# Patient Record
Sex: Male | Born: 1990 | Race: White | Hispanic: No | Marital: Single | Smoking: Former smoker
Health system: Southern US, Community
[De-identification: ages and names within clinical notes are randomized; demographics above are authoritative.]

## PROBLEM LIST (undated history)

## (undated) DIAGNOSIS — Z8701 Personal history of pneumonia (recurrent): Secondary | ICD-10-CM

## (undated) DIAGNOSIS — F419 Anxiety disorder, unspecified: Secondary | ICD-10-CM

## (undated) DIAGNOSIS — J45909 Unspecified asthma, uncomplicated: Secondary | ICD-10-CM

## (undated) HISTORY — PX: WISDOM TOOTH EXTRACTION: SHX21

## (undated) HISTORY — PX: CHOLECYSTECTOMY: SHX55

---

## 2013-11-19 ENCOUNTER — Telehealth: Payer: Self-pay | Admitting: Family Medicine

## 2013-11-19 NOTE — Telephone Encounter (Signed)
Nasal congestion, sore throat, and cough x 1 week.  Has not taken anything OTC. Has been exposed to strep throat.   [place] for abd pain 2 months ago. Diagnosed with gallstones. Has not followed up with anyone. Went back to ER 2 weeks ago and again told to see PCP for referral to surgeon.  Appt scheduled for tomorrow. Patient aware.

## 2013-11-20 ENCOUNTER — Ambulatory Visit (INDEPENDENT_AMBULATORY_CARE_PROVIDER_SITE_OTHER): Payer: Medicare Other | Admitting: Family Medicine

## 2013-11-20 ENCOUNTER — Encounter (INDEPENDENT_AMBULATORY_CARE_PROVIDER_SITE_OTHER): Payer: Self-pay

## 2013-11-20 ENCOUNTER — Encounter: Payer: Self-pay | Admitting: Family Medicine

## 2013-11-20 VITALS — BP 114/76 | HR 82 | Temp 98.4°F | Ht 74.0 in | Wt 282.4 lb

## 2013-11-20 DIAGNOSIS — J029 Acute pharyngitis, unspecified: Secondary | ICD-10-CM

## 2013-11-20 DIAGNOSIS — K8020 Calculus of gallbladder without cholecystitis without obstruction: Secondary | ICD-10-CM

## 2013-11-20 DIAGNOSIS — R21 Rash and other nonspecific skin eruption: Secondary | ICD-10-CM

## 2013-11-20 DIAGNOSIS — J069 Acute upper respiratory infection, unspecified: Secondary | ICD-10-CM

## 2013-11-20 LAB — POCT RAPID STREP A (OFFICE): Rapid Strep A Screen: NEGATIVE

## 2013-11-20 MED ORDER — AZITHROMYCIN 250 MG PO TABS: ORAL_TABLET | ORAL | Status: DC

## 2013-11-20 MED ORDER — METHYLPREDNISOLONE (PAK) 4 MG PO TABS: ORAL_TABLET | ORAL | Status: DC

## 2013-11-20 NOTE — Progress Notes (Signed)
   Subjective:    Patient ID: Alex Callahan, male    DOB: 10/16/1990, 23 y.o.   MRN: [id number]  HPI  This 23 y.o. male presents for evaluation of sore throat and uri sx's for a week. He has been having Some chronic abdominal pain and nausea and states he was told by ER physician to get his PCP to refer him to surgery.  He has had US which showed gallstones according to patient.  He is accompanied by his wife who states his tattoos are ugly and he has been having itching and Pruritis on the tattoos especially on the areas with red and he wants this checked out.  Review of Systems C/o pruritis, abdominal pain, sore throat, and uri sx's.   No chest pain, SOB, HA, dizziness, vision change, N/V, diarrhea, constipation, dysuria, urinary urgency or frequency, myalgias, arthralgias or rash.  Objective:   Physical Exam  Well developed well nourished male.  HEENT - Head atraumatic Normocephalic                Eyes - PERRLA, Conjuctiva - clear Sclera- Clear EOMI                Ears - EAC's Wnl TM's Wnl Gross Hearing WNL                Throat - oropharanx wnl Respiratory - Lungs CTA bilateral Cardiac - RRR S1 and S2 without murmur GI - Abdomen soft Nontender and bowel sounds active x 4 Extremities - No edema. Neuro - Grossly intact. Skin - Tattos on legs and shoulder with raised dry scaly rash     Assessment & Plan:  Sore throat - Plan: POCT rapid strep A, azithromycin (ZITHROMAX) 250 MG tablet, methylPREDNIsolone (MEDROL DOSPACK) 4 MG tablet, Ambulatory referral to General Surgery  Rash and nonspecific skin eruption - Plan: azithromycin (ZITHROMAX) 250 MG tablet, methylPREDNIsolone (MEDROL DOSPACK) 4 MG tablet, Ambulatory referral to General Surgery  URI (upper respiratory infection) - Plan: azithromycin (ZITHROMAX) 250 MG tablet, methylPREDNIsolone (MEDROL DOSPACK) 4 MG tablet, Ambulatory referral to General Surgery  Gallstones - Plan: azithromycin (ZITHROMAX) 250 MG tablet,  methylPREDNIsolone (MEDROL DOSPACK) 4 MG tablet, Ambulatory referral to General Surgery  Deatra CanterWilliam J Jeannene Tschetter FNP

## 2013-12-04 ENCOUNTER — Ambulatory Visit (INDEPENDENT_AMBULATORY_CARE_PROVIDER_SITE_OTHER): Payer: Medicaid Other | Admitting: Surgery

## 2016-12-18 ENCOUNTER — Encounter: Payer: Self-pay | Admitting: Family Medicine

## 2016-12-18 ENCOUNTER — Ambulatory Visit (INDEPENDENT_AMBULATORY_CARE_PROVIDER_SITE_OTHER): Payer: Medicaid Other | Admitting: Family Medicine

## 2016-12-18 DIAGNOSIS — K8020 Calculus of gallbladder without cholecystitis without obstruction: Secondary | ICD-10-CM

## 2016-12-18 NOTE — Patient Instructions (Signed)
Great to see you!  We are working on an ultrasound and a referral to General surgery in [place].   We will call with labs or send them on mychart within 1 week.    Cholelithiasis Cholelithiasis is also called "gallstones." It is a kind of gallbladder disease. The gallbladder is an organ that stores a liquid (bile) that helps you digest fat. Gallstones may not cause symptoms (may be silent gallstones) until they cause a blockage, and then they can cause pain (gallbladder attack). Follow these instructions at home:  Take over-the-counter and prescription medicines only as told by your doctor.  Stay at a healthy weight.  Eat healthy foods. This includes: ? Eating fewer fatty foods, like fried foods. ? Eating fewer refined carbs (refined carbohydrates). Refined carbs are breads and grains that are highly processed, like white bread and white rice. Instead, choose whole grains like whole-wheat bread and brown rice. ? Eating more fiber. Almonds, fresh fruit, and beans are healthy sources of fiber.  Keep all follow-up visits as told by your doctor. This is important. Contact a doctor if:  You have sudden pain in the upper right side of your belly (abdomen). Pain might spread to your right shoulder or your chest. This may be a sign of a gallbladder attack.  You feel sick to your stomach (are nauseous).  You throw up (vomit).  You have been diagnosed with gallstones that have no symptoms and you get: ? Belly pain. ? Discomfort, burning, or fullness in the upper part of your belly (indigestion). Get help right away if:  You have sudden pain in the upper right side of your belly, and it lasts for more than 2 hours.  You have belly pain that lasts for more than 5 hours.  You have a fever or chills.  You keep feeling sick to your stomach or you keep throwing up.  Your skin or the whites of your eyes turn yellow (jaundice).  You have dark-colored pee (urine).  You have  light-colored poop (stool). Summary  Cholelithiasis is also called "gallstones."  The gallbladder is an organ that stores a liquid (bile) that helps you digest fat.  Silent gallstones are gallstones that do not cause symptoms.  A gallbladder attack may cause sudden pain in the upper right side of your belly. Pain might spread to your right shoulder or your chest. If this happens, contact your doctor.  If you have sudden pain in the upper right side of your belly that lasts for more than 2 hours, get help right away. This information is not intended to replace advice given to you by your health care provider. Make sure you discuss any questions you have with your health care provider. Document Released: 12/12/2007 Document Revised: 03/11/2016 Document Reviewed: 03/11/2016 Elsevier Interactive Patient Education  2017 [employer]

## 2016-12-18 NOTE — Progress Notes (Signed)
   HPI  Patient presents today with right upper quadrant pain.  Patient has colicky-type quadrant pain with 2 episodes last week. He is concerned that it is due to his gallbladder.  Patient had ultrasound in 2014 that showed gallstones with distended gallbladder and a positive Murphy sign.  Patient describes it as episodic sharp persistent pain in the right upper quadrant. Caused by greasy foods, spicy foods,  PMH: Smoking status noted ROS: Per HPI  Objective: BP 109/72   Pulse 80   Temp 97.3 F (36.3 C) (Oral)   Ht '6\' 2"'$  (1.88 m)   Wt 276 lb 6.4 oz (125.4 kg)   BMI 35.49 kg/m  Gen: NAD, alert, cooperative with exam HEENT: NCAT CV: RRR, good S1/S2, no murmur Resp: CTABL, no wheezes, non-labored Abd: Soft, mild tenderness to palpation nonspecifically, no discrete tenderness to palpation right upper quadrant, no Murphy sign Ext: No edema, warm Neuro: Alert and oriented, No gross deficits  Assessment and plan:  # Cholelithiasis With characteristic right upper quadrant pain Labs Ultrasound, last ultrasound was 4 years ago Refer to general surgery  Orders Placed This Encounter  Procedures  . US Abdomen Complete    Standing Status:   Future    Standing Expiration Date:   02/17/2018    Order Specific Question:   Reason for Exam (SYMPTOM  OR DIAGNOSIS REQUIRED)    Answer:   cholelithiasis, US shown in 2014, now with pain.    Order Specific Question:   Preferred imaging location?    Answer:   [facility] Hepatic function panel  . CBC with Differential/Platelet  . Ambulatory referral to General Surgery    Referral Priority:   Routine    Referral Type:   Surgical    Referral Reason:   Specialty Services Required    Requested Specialty:   General Surgery    Number of Visits Requested:   [place], MD [facility] 12/18/2016, 4:03 PM

## 2016-12-19 LAB — CBC WITH DIFFERENTIAL/PLATELET
BASOS: 0 %
Basophils Absolute: 0 10*3/uL (ref 0.0–0.2)
EOS (ABSOLUTE): 0.5 10*3/uL — ABNORMAL HIGH (ref 0.0–0.4)
EOS: 5 %
HEMATOCRIT: 43.4 % (ref 37.5–51.0)
Hemoglobin: 14.8 g/dL (ref 13.0–17.7)
Immature Grans (Abs): 0 10*3/uL (ref 0.0–0.1)
Immature Granulocytes: 0 %
LYMPHS: 32 %
Lymphocytes Absolute: 3.4 10*3/uL — ABNORMAL HIGH (ref 0.7–3.1)
MCH: 29.4 pg (ref 26.6–33.0)
MCHC: 34.1 g/dL (ref 31.5–35.7)
MCV: 86 fL (ref 79–97)
Monocytes Absolute: 0.8 10*3/uL (ref 0.1–0.9)
Monocytes: 8 %
NEUTROS PCT: 55 %
Neutrophils Absolute: 5.9 10*3/uL (ref 1.4–7.0)
Platelets: 304 10*3/uL (ref 150–379)
RBC: 5.04 x10E6/uL (ref 4.14–5.80)
RDW: 14.1 % (ref 12.3–15.4)
WBC: 10.6 10*3/uL (ref 3.4–10.8)

## 2016-12-19 LAB — BMP8+EGFR
BUN/Creatinine Ratio: 13 (ref 9–20)
BUN: 14 mg/dL (ref 6–20)
CO2: 25 mmol/L (ref 20–29)
CREATININE: 1.08 mg/dL (ref 0.76–1.27)
Calcium: 9.5 mg/dL (ref 8.7–10.2)
Chloride: 100 mmol/L (ref 96–106)
GFR calc Af Amer: 110 mL/min/{1.73_m2} (ref >59)
GFR calc non Af Amer: 95 mL/min/{1.73_m2} (ref >59)
GLUCOSE: 90 mg/dL (ref 65–99)
Potassium: 4.1 mmol/L (ref 3.5–5.2)
SODIUM: 139 mmol/L (ref 134–144)

## 2016-12-19 LAB — HEPATIC FUNCTION PANEL
ALBUMIN: 4.6 g/dL (ref 3.5–5.5)
ALT: 19 IU/L (ref 0–44)
AST: 18 IU/L (ref 0–40)
Alkaline Phosphatase: 75 IU/L (ref 39–117)
Bilirubin Total: 0.7 mg/dL (ref 0.0–1.2)
Bilirubin, Direct: 0.18 mg/dL (ref 0.00–0.40)
TOTAL PROTEIN: 7 g/dL (ref 6.0–8.5)

## 2016-12-20 ENCOUNTER — Encounter: Payer: Self-pay | Admitting: Family Medicine

## 2017-01-03 ENCOUNTER — Other Ambulatory Visit: Payer: Self-pay

## 2017-01-29 ENCOUNTER — Ambulatory Visit: Payer: Self-pay | Admitting: General Surgery

## 2017-01-30 ENCOUNTER — Emergency Department (HOSPITAL_COMMUNITY): Payer: Medicaid Other

## 2017-01-30 ENCOUNTER — Emergency Department (HOSPITAL_COMMUNITY)
Admission: EM | Admit: 2017-01-30 | Discharge: 2017-01-30 | Disposition: A | Discharge status: Home / Self Care | Payer: Medicaid Other | Attending: Emergency Medicine | Admitting: Emergency Medicine

## 2017-01-30 ENCOUNTER — Encounter (HOSPITAL_COMMUNITY): Payer: Self-pay | Admitting: Emergency Medicine

## 2017-01-30 DIAGNOSIS — W292XXA Contact with other powered household machinery, initial encounter: Secondary | ICD-10-CM | POA: Diagnosis not present

## 2017-01-30 DIAGNOSIS — Y929 Unspecified place or not applicable: Secondary | ICD-10-CM | POA: Diagnosis not present

## 2017-01-30 DIAGNOSIS — F17210 Nicotine dependence, cigarettes, uncomplicated: Secondary | ICD-10-CM | POA: Diagnosis not present

## 2017-01-30 DIAGNOSIS — Y939 Activity, unspecified: Secondary | ICD-10-CM | POA: Diagnosis not present

## 2017-01-30 DIAGNOSIS — Y999 Unspecified external cause status: Secondary | ICD-10-CM | POA: Insufficient documentation

## 2017-01-30 DIAGNOSIS — S63642A Sprain of metacarpophalangeal joint of left thumb, initial encounter: Secondary | ICD-10-CM | POA: Insufficient documentation

## 2017-01-30 DIAGNOSIS — S60932A Unspecified superficial injury of left thumb, initial encounter: Secondary | ICD-10-CM | POA: Diagnosis present

## 2017-01-30 MED ORDER — TRAMADOL HCL 50 MG PO TABS: 50.0000 mg | ORAL_TABLET | Freq: Four times a day (QID) | ORAL | 0 refills | Status: DC | PRN

## 2017-01-30 MED ORDER — IBUPROFEN 600 MG PO TABS: 600.0000 mg | ORAL_TABLET | Freq: Four times a day (QID) | ORAL | 0 refills | Status: DC | PRN

## 2017-01-30 MED ORDER — TRAMADOL HCL 50 MG PO TABS
50.0000 mg | ORAL_TABLET | Freq: Once | ORAL | Status: AC
  Administered 2017-01-30: 50 mg via ORAL
  Filled 2017-01-30: qty 1

## 2017-01-30 MED ORDER — IBUPROFEN 800 MG PO TABS
800.0000 mg | ORAL_TABLET | Freq: Once | ORAL | Status: AC
  Administered 2017-01-30: 800 mg via ORAL
  Filled 2017-01-30: qty 1

## 2017-01-30 NOTE — Discharge Instructions (Addendum)
Wear the splint to protect your finger.  Use ice and elevation as much as possible for the next several days to help reduce the swelling.  Take the medications prescribed for pain and inflammation.  Call your doctor for a recheck of your injury in 1 week if not improving.  You may benefit from physical therapy of your finger if it is not getting better over the next week. You may take the tramadol prescribed for pain relief.  This will make you drowsy - do not drive within 4 hours of taking this medication.

## 2017-01-30 NOTE — ED Triage Notes (Signed)
Pt states he was moving an air compressor and it fell and when he went to catch it, it bent his left thumb backward.

## 2017-02-01 NOTE — ED Provider Notes (Signed)
[facility]Y DEPT Provider Note   CSN: [id number] Arrival date & time: 01/30/17  1727     History   Chief Complaint Chief Complaint  Patient presents with  . Finger Injury    HPI Alex Callahan is a 26 y.o.right handed male presenting for evaluation of a painful left thumb since he hyperextended the digit while attempting to lift a heavy air compressor earlier today. He reports it was dislocated, but he popped it back into place. Pain is constant, worse with movement and better at rest.  He denies numbness in the finger tip.  He has applied ice and has had no other treatments prior to arrival.  The history is provided by the patient, the spouse and a parent.    History reviewed. No pertinent past medical history.  Patient Active Problem List   Diagnosis Date Noted  . Cholelithiasis 12/18/2016    Past Surgical History:  Procedure Laterality Date  . WISDOM TOOTH EXTRACTION         Home Medications    Prior to Admission medications   Medication Sig Start Date End Date Taking? Authorizing Provider  ibuprofen (ADVIL,MOTRIN) 600 MG tablet Take 1 tablet (600 mg total) by mouth every 6 (six) hours as needed. 01/30/17   Burgess AmorIdol, Zackry Deines, PA-C  traMADol (ULTRAM) 50 MG tablet Take 1 tablet (50 mg total) by mouth every 6 (six) hours as needed. 01/30/17   Burgess AmorIdol, Edda Orea, PA-C    Family History History reviewed. No pertinent family history.  Social History Social History  Substance Use Topics  . Smoking status: Current Every Day Smoker    Packs/day: 1.00    Types: Cigarettes    Start date: 05/12/2001  . Smokeless tobacco: Not on file  . Alcohol use Yes     Comment: rare     Allergies   Patient has no known allergies.   Review of Systems Review of Systems  Constitutional: Negative for fever.  Musculoskeletal: Positive for arthralgias and joint swelling. Negative for myalgias.  Neurological: Negative for weakness and numbness.     Physical Exam Updated Vital Signs BP  107/73 (BP Location: Right Arm)   Pulse 92   Temp 98 F (36.7 C) (Oral)   Resp 16   Ht 6\' 3"  (1.905 m)   Wt 126.6 kg (279 lb)   SpO2 100%   BMI 34.87 kg/m   Physical Exam  Constitutional: He appears well-developed and well-nourished.  HENT:  Head: Atraumatic.  Neck: Normal range of motion.  Cardiovascular:  Pulses equal bilaterally  Musculoskeletal: He exhibits tenderness.       Left hand: He exhibits bony tenderness. He exhibits normal capillary refill and no deformity. Normal sensation noted. Decreased strength noted.  Decreased resistance of thumb secondary to pain.  He can flex/ext the digit with minimal pain.  Pain localized to cmc joint. No deformity. Distal sensation intact with less than 2 sec fingertip cap refill.  Neurological: He is alert. He has normal strength. He displays normal reflexes. No sensory deficit.  Skin: Skin is warm and dry.  Psychiatric: He has a normal mood and affect.     ED Treatments / Results  Labs (all labs ordered are listed, but only abnormal results are displayed) Labs Reviewed - No data to display  EKG  EKG Interpretation None       Radiology Dg Hand Complete Left  Result Date: 01/30/2017 CLINICAL DATA:  Air compressor fell on hand with thumb pain, initial encounter EXAM: LEFT HAND - COMPLETE 3+  VIEW COMPARISON:  None. FINDINGS: There is no evidence of fracture or dislocation. There is no evidence of arthropathy or other focal bone abnormality. Soft tissues are unremarkable. IMPRESSION: No acute abnormality noted. Electronically Signed   By: Alcide CleverMark  Lukens M.D.   On: 01/30/2017 18:27    Procedures Procedures (including critical care time)  Medications Ordered in ED Medications  ibuprofen (ADVIL,MOTRIN) tablet 800 mg (800 mg Oral Given 01/30/17 1837)  traMADol (ULTRAM) tablet 50 mg (50 mg Oral Given 01/30/17 2005)     Initial Impression / Assessment and Plan / ED Course  I have reviewed the triage vital signs and the nursing  notes.  Pertinent labs & imaging results that were available during my care of the patient were reviewed by me and considered in my medical decision making (see chart for details).     RICE, ibuprofen, tramadol. Thumb spica splint provided. Recheck by pcp if not improved x 1 week.  Final Clinical Impressions(s) / ED Diagnoses   Final diagnoses:  Sprain of metacarpophalangeal (MCP) joint of left thumb, initial encounter    New Prescriptions Discharge Medication List as of 01/30/2017  7:54 PM    START taking these medications   Details  ibuprofen (ADVIL,MOTRIN) 600 MG tablet Take 1 tablet (600 mg total) by mouth every 6 (six) hours as needed., Starting Wed 01/30/2017, Print    traMADol (ULTRAM) 50 MG tablet Take 1 tablet (50 mg total) by mouth every 6 (six) hours as needed., Starting Wed 01/30/2017, Print         Burgess Amordol, Arnez Stoneking, PA-C 02/01/17 1248    Rolland PorterJames, Mark, MD 02/11/17 [phone]

## 2017-12-21 IMAGING — CR DG HAND COMPLETE 3+V*L*
3 series · 3 of 3 positions shown · non-contrast
Comparison: None.

CLINICAL DATA: Air compressor fell on hand with thumb pain, initial
encounter

EXAM:
LEFT HAND - COMPLETE 3+ VIEW

[x hand pa left]
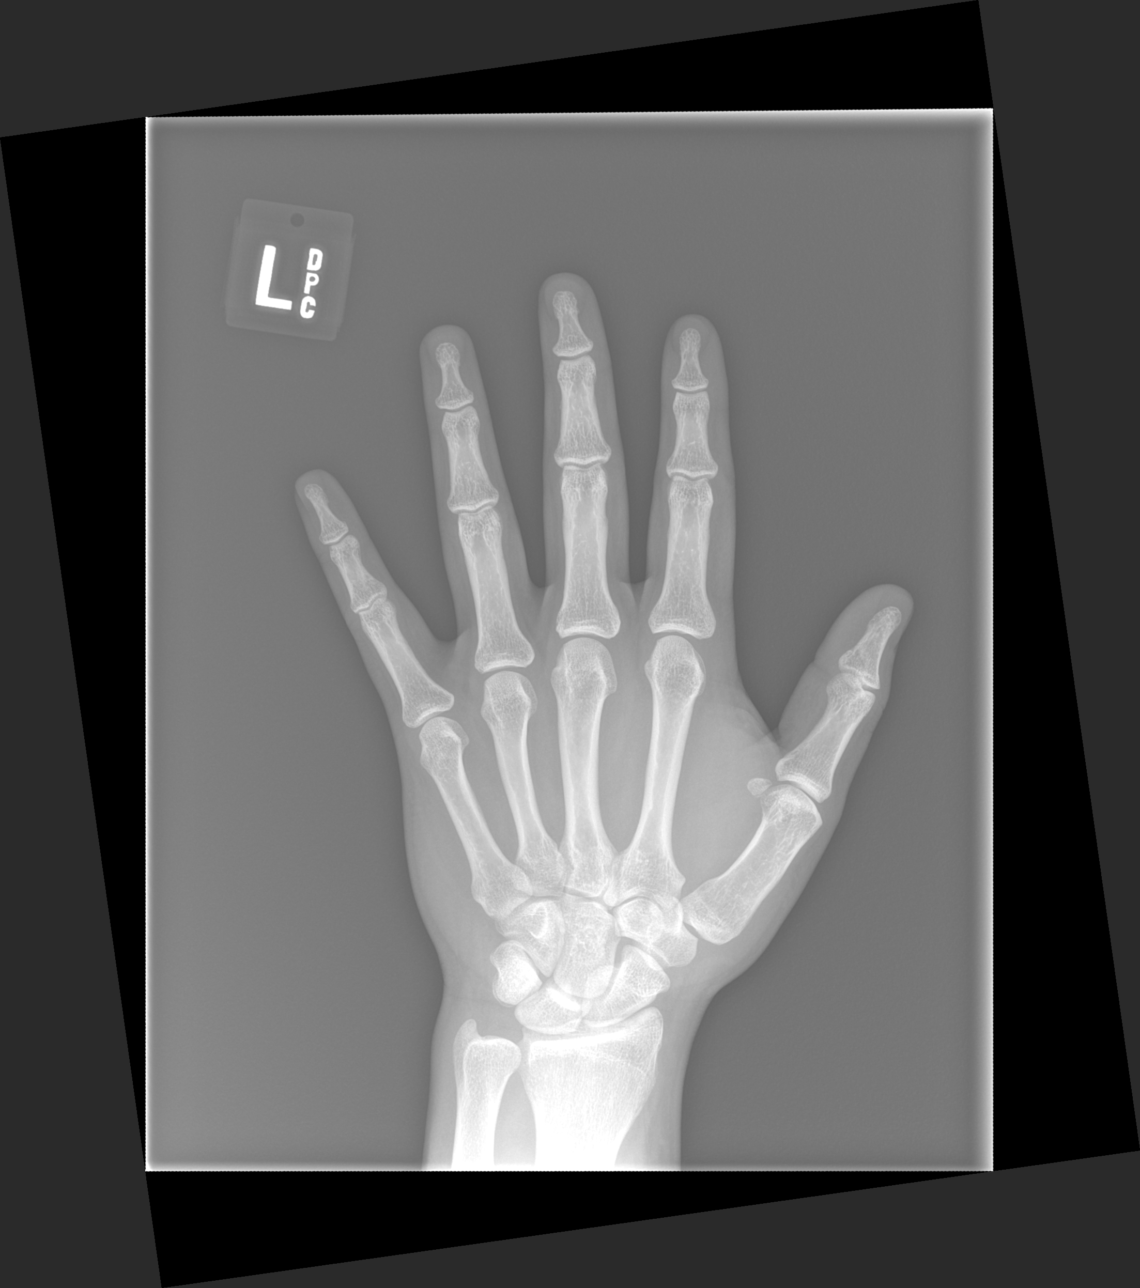

[x hand obl left]
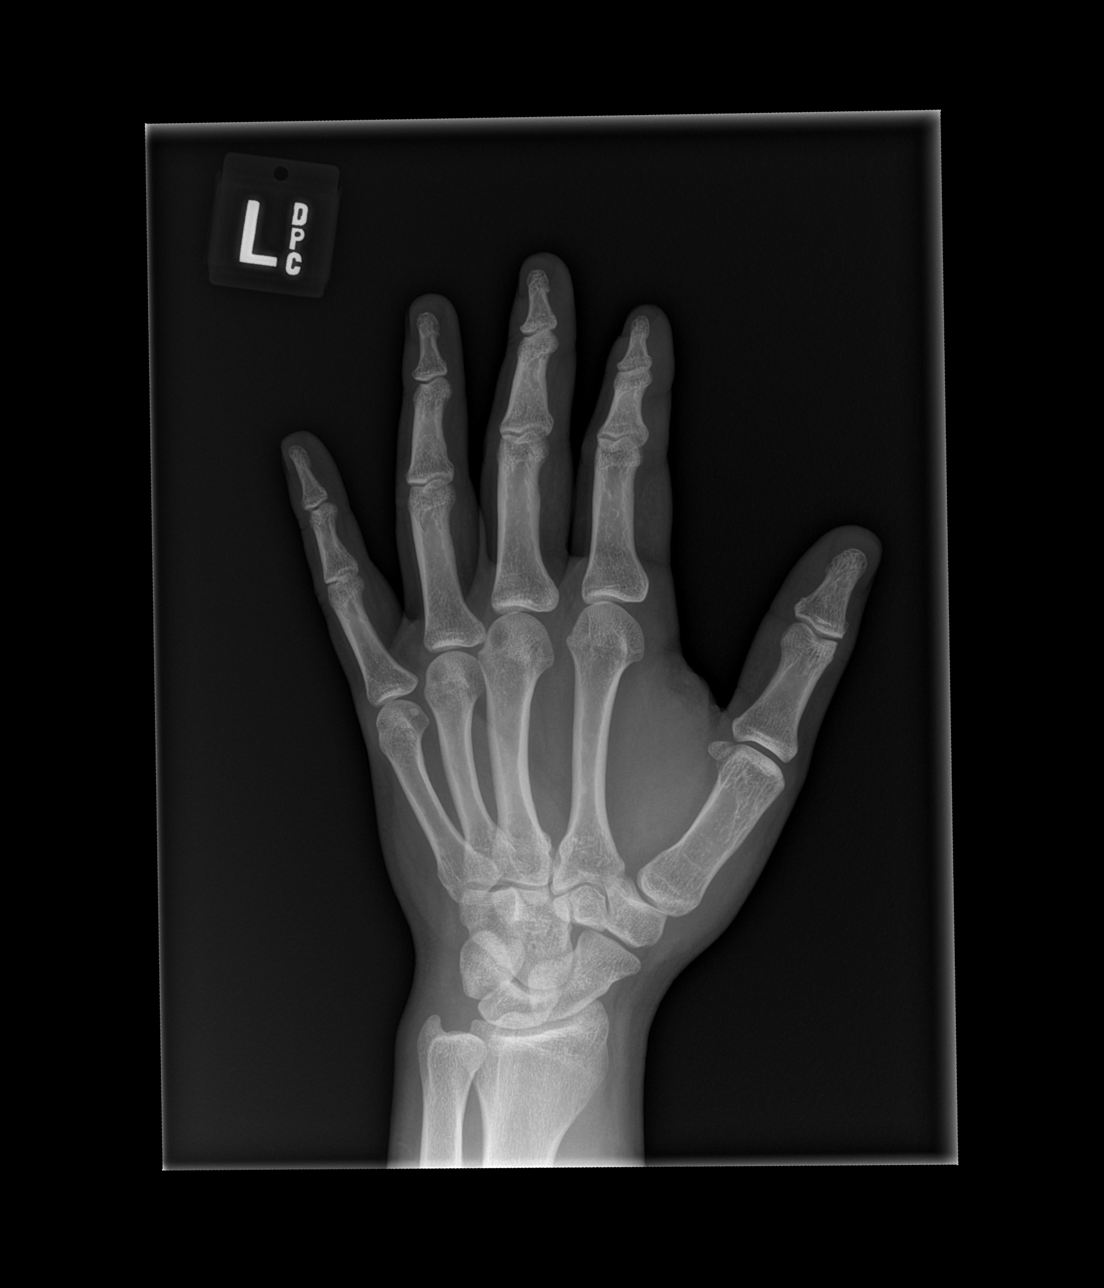

[x hand lat left]
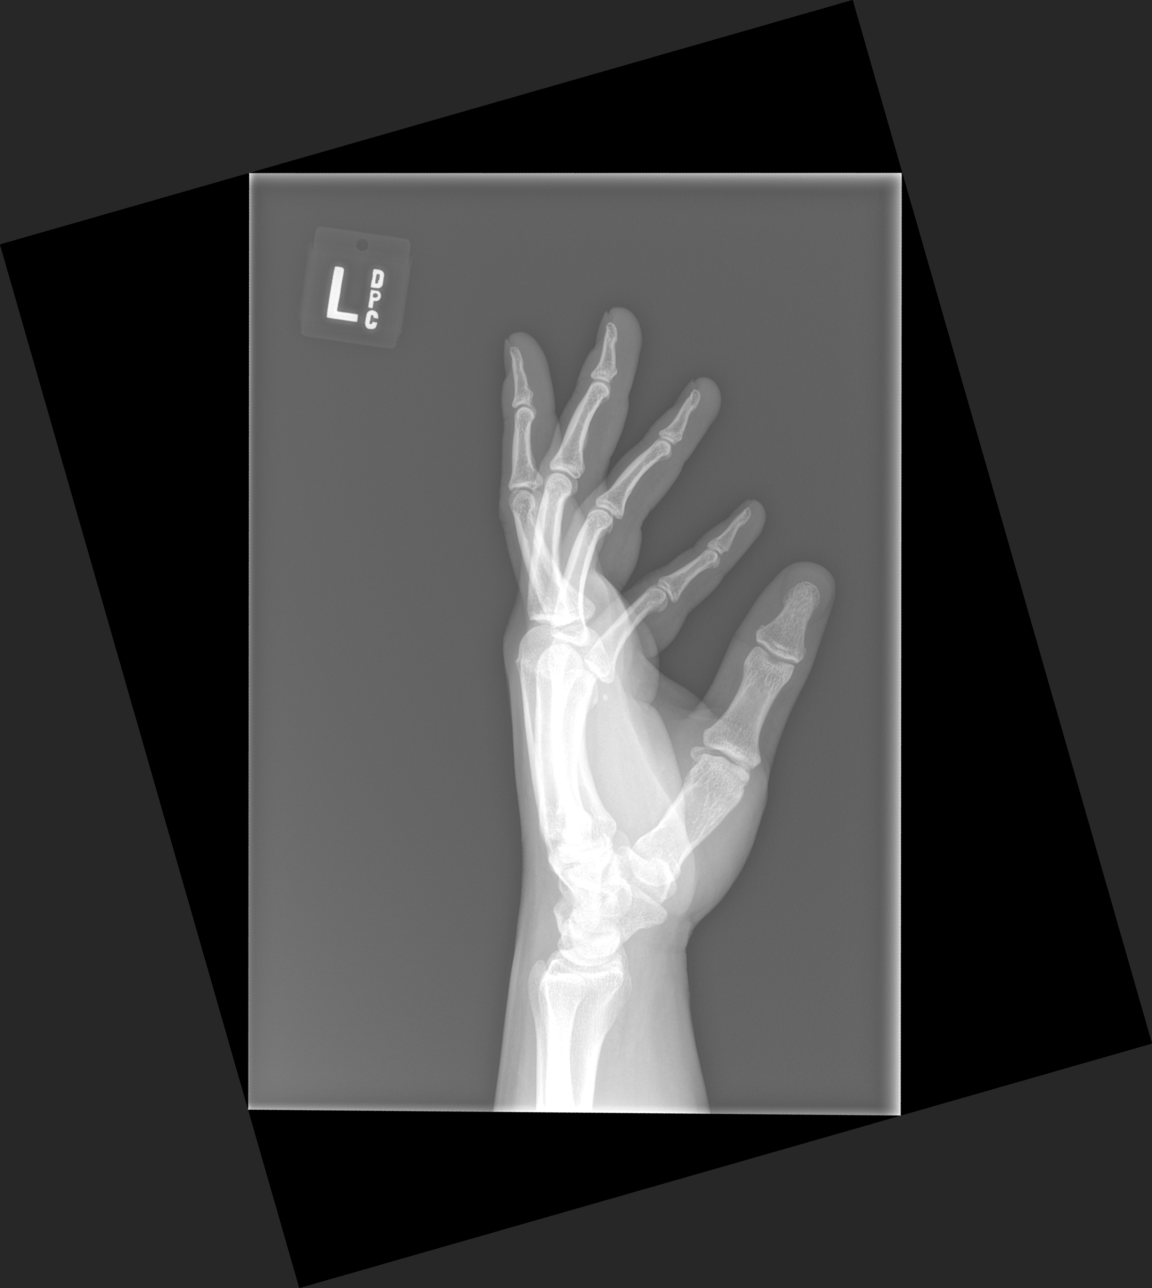

[3 of 3 positions shown; findings below may reference images not displayed]

FINDINGS: There is no evidence of fracture or dislocation. There is no
evidence of arthropathy or other focal bone abnormality. Soft
tissues are unremarkable.
IMPRESSION: No acute abnormality noted.

## 2018-05-13 ENCOUNTER — Encounter: Payer: Self-pay | Admitting: Family Medicine

## 2018-05-13 ENCOUNTER — Ambulatory Visit: Payer: Medicaid Other | Admitting: Family Medicine

## 2018-05-13 VITALS — BP 116/81 | HR 94 | Temp 98.9°F | Ht 75.0 in | Wt 295.0 lb

## 2018-05-13 DIAGNOSIS — F320 Major depressive disorder, single episode, mild: Secondary | ICD-10-CM | POA: Diagnosis not present

## 2018-05-13 DIAGNOSIS — R454 Irritability and anger: Secondary | ICD-10-CM

## 2018-05-13 DIAGNOSIS — H6502 Acute serous otitis media, left ear: Secondary | ICD-10-CM

## 2018-05-13 DIAGNOSIS — F411 Generalized anxiety disorder: Secondary | ICD-10-CM | POA: Diagnosis not present

## 2018-05-13 DIAGNOSIS — J069 Acute upper respiratory infection, unspecified: Secondary | ICD-10-CM

## 2018-05-13 MED ORDER — AMOXICILLIN 875 MG PO TABS
875.0000 mg | ORAL_TABLET | Freq: Two times a day (BID) | ORAL | 0 refills | Status: AC
Start: 2018-05-13 — End: 2018-05-23

## 2018-05-13 MED ORDER — BENZONATATE 100 MG PO CAPS: 100.0000 mg | ORAL_CAPSULE | Freq: Three times a day (TID) | ORAL | 0 refills | Status: AC | PRN

## 2018-05-13 MED ORDER — FLUOXETINE HCL 20 MG PO TABS
20.0000 mg | ORAL_TABLET | Freq: Every day | ORAL | 0 refills | Status: DC
Start: 2018-05-13 — End: 2018-05-16

## 2018-05-13 NOTE — Progress Notes (Signed)
Subjective:    Patient ID: Alex Callahan, male    DOB: October 13, 1990, 27 y.o.   MRN: [id number]  Chief Complaint:  Cough (x 3 weeks, productive, worse at night; taking Tylenol Cold and Cough and using cough drops) and Anger issues (ongoing for years but feels recently has worsened, gets angry over simple things)   HPI: Alex Callahan is a 27 y.o. male presenting on 05/13/2018 for Cough (x 3 weeks, productive, worse at night; taking Tylenol Cold and Cough and using cough drops) and Anger issues (ongoing for years but feels recently has worsened, gets angry over simple things)  Pt presents today for cough, congestion, and ear pressure. States this started 2 weeks ago and has become worse over the last 2 days. States he is now coughing up mucus. Denies fever, chills, headaches, or shortness of breath. States he has tried over the counter could and cough medications without relief.  Pt also reports increasing anxiety and anger outbursts. Pt states he has had issues with this for several years but has noticed an increase over the last 3 weeks. States he will yell at his family and kids for no reason. States this happens on a daily basis. States he has trouble sleeping at times and then wants to sleep all the time. States he worries on a regular basis and has a very difficult time enjoying things on a daily basis. He denies suicidal or homicidal ideations. States he has never been on medications for this and has not seen a Social worker. States he does not believe in counselors. He denies heat or cold intolerance, weight changes, hair or nail changes, or general weakness.    Office Visit from 05/13/2018 in [facility]  PHQ-9 Total Score  14     GAD 7 : Generalized Anxiety Score 05/13/2018  Nervous, Anxious, on Edge 3  Control/stop worrying 2  Worry too much - different things 2  Trouble relaxing 1  Restless 2  Easily annoyed or irritable 3  Afraid - awful might happen 0  Total  GAD 7 Score 13  Anxiety Difficulty Very difficult    Relevant past medical, surgical, family, and social history reviewed and updated as indicated.  Allergies and medications reviewed and updated.   History reviewed. No pertinent past medical history.  Past Surgical History:  Procedure Laterality Date  . WISDOM TOOTH EXTRACTION      Social History   Socioeconomic History  . Marital status: Single    Spouse name: Not on file  . Number of children: Not on file  . Years of education: Not on file  . Highest education level: Not on file  Occupational History  . Not on file  Social Needs  . Financial resource strain: Not on file  . Food insecurity:    Worry: Not on file    Inability: Not on file  . Transportation needs:    Medical: Not on file    Non-medical: Not on file  Tobacco Use  . Smoking status: Current Some Day Smoker    Packs/day: 0.25    Types: Cigarettes    Start date: 05/12/2001  . Smokeless tobacco: Former Systems developer    Types: Chew  Substance and Sexual Activity  . Alcohol use: Not Currently    Comment: rare  . Drug use: No  . Sexual activity: Not on file  Lifestyle  . Physical activity:    Days per week: Not on file    Minutes per session:  Not on file  . Stress: Not on file  Relationships  . Social connections:    Talks on phone: Not on file    Gets together: Not on file    Attends religious service: Not on file    Active member of club or organization: Not on file    Attends meetings of clubs or organizations: Not on file    Relationship status: Not on file  . Intimate partner violence:    Fear of current or ex partner: Not on file    Emotionally abused: Not on file    Physically abused: Not on file    Forced sexual activity: Not on file  Other Topics Concern  . Not on file  Social History Narrative  . Not on file    Outpatient Encounter Medications as of 05/13/2018  Medication Sig  . amoxicillin (AMOXIL) 875 MG tablet Take 1 tablet (875 mg  total) by mouth 2 (two) times daily for 10 days. 1 po BID  . benzonatate (TESSALON PERLES) 100 MG capsule Take 1 capsule (100 mg total) by mouth 3 (three) times daily as needed for up to 10 days for cough.  Marland Kitchen FLUoxetine (PROZAC) 20 MG tablet Take 1 tablet (20 mg total) by mouth daily.  . [DISCONTINUED] ibuprofen (ADVIL,MOTRIN) 600 MG tablet Take 1 tablet (600 mg total) by mouth every 6 (six) hours as needed.  . [DISCONTINUED] traMADol (ULTRAM) 50 MG tablet Take 1 tablet (50 mg total) by mouth every 6 (six) hours as needed.   No facility-administered encounter medications on file as of 05/13/2018.     No Known Allergies  Review of Systems  Constitutional: Positive for activity change. Negative for appetite change, chills, fatigue, fever and unexpected weight change.  HENT: Positive for congestion and ear pain. Negative for hearing loss, postnasal drip, rhinorrhea, sinus pressure, sinus pain and sore throat.   Respiratory: Positive for cough. Negative for chest tightness and shortness of breath.   Cardiovascular: Negative for chest pain, palpitations and leg swelling.  Gastrointestinal: Negative for abdominal pain, constipation, diarrhea, nausea and vomiting.  Endocrine: Negative for cold intolerance, heat intolerance, polydipsia, polyphagia and polyuria.  Musculoskeletal: Negative for myalgias.  Neurological: Negative for dizziness, weakness, light-headedness, numbness and headaches.  Psychiatric/Behavioral: Positive for agitation, behavioral problems, decreased concentration, dysphoric mood and sleep disturbance. Negative for confusion, hallucinations, self-injury and suicidal ideas. The patient is nervous/anxious. The patient is not hyperactive.   All other systems reviewed and are negative.       Objective:    BP 116/81   Pulse 94   Temp 98.9 F (37.2 C) (Oral)   Ht _0  (1.905 m)   Wt 295 lb (133.8 kg)   BMI 36.87 kg/m    Wt Readings from Last 3 Encounters:  05/13/18 295 lb  (133.8 kg)  01/30/17 279 lb (126.6 kg)  12/18/16 276 lb 6.4 oz (125.4 kg)    Physical Exam  Constitutional: He is oriented to person, place, and time. He appears well-developed and well-nourished. He is cooperative. No distress.  Poor hygiene   HENT:  Head: Normocephalic and atraumatic.  Right Ear: Hearing, tympanic membrane, external ear and ear canal normal.  Left Ear: Hearing, external ear and ear canal normal. Tympanic membrane is erythematous and bulging. Tympanic membrane is not perforated.  Nose: Mucosal edema present. No rhinorrhea. Right sinus exhibits no maxillary sinus tenderness and no frontal sinus tenderness. Left sinus exhibits no maxillary sinus tenderness and no frontal sinus tenderness.  Mouth/Throat: Uvula  is midline and mucous membranes are normal. Posterior oropharyngeal erythema present. No oropharyngeal exudate, posterior oropharyngeal edema or tonsillar abscesses. No tonsillar exudate.  Eyes: Pupils are equal, round, and reactive to light. Conjunctivae, EOM and lids are normal.  Neck: Trachea normal, full passive range of motion without pain and phonation normal. Neck supple. No thyroid mass and no thyromegaly present.  Cardiovascular: Normal rate, regular rhythm, S1 normal and S2 normal. Exam reveals no gallop and no friction rub.  No murmur heard. Pulmonary/Chest: Effort normal and breath sounds normal. No respiratory distress.  Lymphadenopathy:       Head (left side): Posterior auricular adenopathy present.  Neurological: He is alert and oriented to person, place, and time. He has normal strength and normal reflexes. No cranial nerve deficit or sensory deficit. He displays a negative Romberg sign.  Skin: Skin is warm, dry and intact. Capillary refill takes less than 2 seconds.  Psychiatric: His behavior is normal. Judgment and thought content normal. His mood appears anxious. He is not actively hallucinating. Cognition and memory are normal. He exhibits a depressed  mood. He expresses no homicidal and no suicidal ideation. He expresses no suicidal plans and no homicidal plans. He is attentive.  Nursing note and vitals reviewed.   Results for orders placed or performed in visit on 12/18/16  [facility]  Result Value Ref Range   Glucose 90 65 - 99 mg/dL   BUN 14 6 - 20 mg/dL   Creatinine, Ser 1.08 0.76 - 1.27 mg/dL   GFR calc non Af Amer 95 >59 mL/min/1.73   GFR calc Af Amer 110 >59 mL/min/1.73   BUN/Creatinine Ratio 13 9 - 20   Sodium 139 134 - 144 mmol/L   Potassium 4.1 3.5 - 5.2 mmol/L   Chloride 100 96 - 106 mmol/L   CO2 25 20 - 29 mmol/L   Calcium 9.5 8.7 - 10.2 mg/dL  Hepatic function panel  Result Value Ref Range   Total Protein 7.0 6.0 - 8.5 g/dL   Albumin 4.6 3.5 - 5.5 g/dL   Bilirubin Total 0.7 0.0 - 1.2 mg/dL   Bilirubin, Direct 0.18 0.00 - 0.40 mg/dL   Alkaline Phosphatase 75 39 - 117 IU/L   AST 18 0 - 40 IU/L   ALT 19 0 - 44 IU/L  CBC with Differential/Platelet  Result Value Ref Range   WBC 10.6 3.4 - 10.8 x10E3/uL   RBC 5.04 4.14 - 5.80 x10E6/uL   Hemoglobin 14.8 13.0 - 17.7 g/dL   Hematocrit 43.4 37.5 - 51.0 %   MCV 86 79 - 97 fL   MCH 29.4 26.6 - 33.0 pg   MCHC 34.1 31.5 - 35.7 g/dL   RDW 14.1 12.3 - 15.4 %   Platelets 304 150 - 379 x10E3/uL   Neutrophils 55 Not Estab. %   Lymphs 32 Not Estab. %   Monocytes 8 Not Estab. %   Eos 5 Not Estab. %   Basos 0 Not Estab. %   Neutrophils Absolute 5.9 1.4 - 7.0 x10E3/uL   Lymphocytes Absolute 3.4 (H) 0.7 - 3.1 x10E3/uL   Monocytes Absolute 0.8 0.1 - 0.9 x10E3/uL   EOS (ABSOLUTE) 0.5 (H) 0.0 - 0.4 x10E3/uL   Basophils Absolute 0.0 0.0 - 0.2 x10E3/uL   Immature Granulocytes 0 Not Estab. %   Immature Grans (Abs) 0.0 0.0 - 0.1 x10E3/uL       Pertinent labs & imaging results that were available during my care of the patient were reviewed by me and  considered in my medical decision making.  Assessment & Plan:  Derran was seen today for cough and anger issues.  Diagnoses and  all orders for this visit:  Difficulty controlling anger Referral to psychiatry for counseling. Intensity and frequency of anger outbursts have increased over the last few weeks. Stress management. Medications as prescribed.  -     FLUoxetine (PROZAC) 20 MG tablet; Take 1 tablet (20 mg total) by mouth daily. -     Ambulatory referral to Psychiatry  Current mild episode of major depressive disorder without prior episode [facility]) Referral to psychiatry for counseling. Stress management. Medications as prescribed.  -     TSH -     FLUoxetine (PROZAC) 20 MG tablet; Take 1 tablet (20 mg total) by mouth daily. -     Ambulatory referral to Psychiatry  Generalized anxiety disorder Referral to psychiatry for counseling. Stress management. Medications as prescribed.  -     TSH -     FLUoxetine (PROZAC) 20 MG tablet; Take 1 tablet (20 mg total) by mouth daily. -     Ambulatory referral to Psychiatry  Upper respiratory infection with cough and congestion Increase humidity in the air. Increase water intake. Mucinex as needed, can use regular or extra strength. Medications as prescribed for cough.  -     benzonatate (TESSALON PERLES) 100 MG capsule; Take 1 capsule (100 mg total) by mouth 3 (three) times daily as needed for up to 10 days for cough.  Non-recurrent acute serous otitis media of left ear Avoid cigarette smoke. Tylenol or motrin as needed for fever or pain control. Medications as prescribed.  -     amoxicillin (AMOXIL) 875 MG tablet; Take 1 tablet (875 mg total) by mouth 2 (two) times daily for 10 days. 1 po BID   Continue all other maintenance medications.  Follow up plan: Return in about 2 weeks (around 05/27/2018), or if symptoms worsen or fail to improve.  Educational handout given for otitis, URI, depression, anger, anxiety.   The above assessment and management plan was discussed with the patient. The patient verbalized understanding of and has agreed to the management plan.  Patient is aware to call the clinic if symptoms persist or worsen. Patient is aware when to return to the clinic for a follow-up visit. Patient educated on when it is appropriate to go to the emergency department.   Monia Pouch, FNP-C [facility] [phone]

## 2018-05-13 NOTE — Patient Instructions (Signed)
Upper Respiratory Infection, Adult Most upper respiratory infections (URIs) are caused by a virus. A URI affects the nose, throat, and upper air passages. The most common type of URI is often called "the common cold." Follow these instructions at home:  Take medicines only as told by your doctor.  Gargle warm saltwater or take cough drops to comfort your throat as told by your doctor.  Use a warm mist humidifier or inhale steam from a shower to increase air moisture. This may make it easier to breathe.  Drink enough fluid to keep your pee (urine) clear or pale yellow.  Eat soups and other clear broths.  Have a healthy diet.  Rest as needed.  Go back to work when your fever is gone or your doctor says it is okay. ? You may need to stay home longer to avoid giving your URI to others. ? You can also wear a face mask and wash your hands often to prevent spread of the virus.  Use your inhaler more if you have asthma.  Do not use any tobacco products, including cigarettes, chewing tobacco, or electronic cigarettes. If you need help quitting, ask your doctor. Contact a doctor if:  You are getting worse, not better.  Your symptoms are not helped by medicine.  You have chills.  You are getting more short of breath.  You have brown or red mucus.  You have yellow or brown discharge from your nose.  You have pain in your face, especially when you bend forward.  You have a fever.  You have puffy (swollen) neck glands.  You have pain while swallowing.  You have white areas in the back of your throat. Get help right away if:  You have very bad or constant: ? Headache. ? Ear pain. ? Pain in your forehead, behind your eyes, and over your cheekbones (sinus pain). ? Chest pain.  You have long-lasting (chronic) lung disease and any of the following: ? Wheezing. ? Long-lasting cough. ? Coughing up blood. ? A change in your usual mucus.  You have a stiff neck.  You have  changes in your: ? Vision. ? Hearing. ? Thinking. ? Mood. This information is not intended to replace advice given to you by your health care provider. Make sure you discuss any questions you have with your health care provider. Document Released: 12/12/2007 Document Revised: 02/26/2016 Document Reviewed: 09/30/2013 Elsevier Interactive Patient Education  2018 [employer]. Otitis Media, Adult Otitis media is redness, soreness, and puffiness (swelling) in the space just behind your eardrum (middle ear). It may be caused by allergies or infection. It often happens along with a cold. Follow these instructions at home:  Take your medicine as told. Finish it even if you start to feel better.  Only take over-the-counter or prescription medicines for pain, discomfort, or fever as told by your doctor.  Follow up with your doctor as told. Contact a doctor if:  You have otitis media only in one ear, or bleeding from your nose, or both.  You notice a lump on your neck.  You are not getting better in 3-5 days.  You feel worse instead of better. Get help right away if:  You have pain that is not helped with medicine.  You have puffiness, redness, or pain around your ear.  You get a stiff neck.  You cannot move part of your face (paralysis).  You notice that the bone behind your ear hurts when you touch it. This information is  not intended to replace advice given to you by your health care provider. Make sure you discuss any questions you have with your health care provider. Document Released: 12/12/2007 Document Revised: 12/01/2015 Document Reviewed: 01/20/2013 Elsevier Interactive Patient Education  2017 [employer]. Major Depressive Disorder, Adult Major depressive disorder (MDD) is a mental health condition. MDD often makes you feel sad, hopeless, or helpless. MDD can also cause symptoms in your body. MDD can affect your:  Work.  School.  Relationships.  Other normal  activities.  MDD can range from mild to very bad. It may occur once (single episode MDD). It can also occur many times (recurrent MDD). The main symptoms of MDD often include:  Feeling sad, depressed, or irritable most of the time.  Loss of interest.  MDD symptoms also include:  Sleeping too much or too little.  Eating too much or too little.  A change in your weight.  Feeling tired (fatigue) or having low energy.  Feeling worthless.  Feeling guilty.  Trouble making decisions.  Trouble thinking clearly.  Thoughts of suicide or harming others.  Feeling weak.  Feeling agitated.  Keeping yourself from being around other people (isolation).  Follow these instructions at home: Activity  Do these things as told by your doctor: ? Go back to your normal activities. ? Exercise regularly. ? Spend time outdoors. Alcohol  Talk with your doctor about how alcohol can affect your antidepressant medicines.  Do not drink alcohol. Or, limit how much alcohol you drink. ? This means no more than 1 drink a day for nonpregnant women and 2 drinks a day for men. One drink equals one of these:  12 oz of beer.  5 oz of wine.  1 oz of hard liquor. General instructions  Take over-the-counter and prescription medicines only as told by your doctor.  Eat a healthy diet.  Get plenty of sleep.  Find activities that you enjoy. Make time to do them.  Think about joining a support group. Your doctor may be able to suggest a group for you.  Keep all follow-up visits as told by your doctor. This is important. Where to find more information:  [employer] on Mental Illness: ? www.nami.org  [place] [employer] of Mental Health: ? http://www.maynard.net/  National Suicide Prevention Lifeline: ? 7152729501. This is free, 24-hour help. Contact a doctor if:  Your symptoms get worse.  You have new symptoms. Get help right away if:  You self-harm.  You see, hear, taste,  smell, or feel things that are not present (hallucinate). If you ever feel like you may hurt yourself or others, or have thoughts about taking your own life, get help right away. You can go to your nearest emergency department or call:  Your local emergency services (911 in the [place]  A suicide crisis helpline, such as the National Suicide Prevention Lifeline: ? 413-118-7081. This is open 24 hours a day.  This information is not intended to replace advice given to you by your health care provider. Make sure you discuss any questions you have with your health care provider. Document Released: 06/06/2015 Document Revised: 03/11/2016 Document Reviewed: 03/11/2016 Elsevier Interactive Patient Education  2017 [employer]. Tips for Managing Your Anger HOW CAN ANGER AFFECT MY LIFE? Everyone feels angry from time to time. It is okay and normal to feel angry. However, the way that you behave or react to anger can make it a problem. If you react too strongly to anger or you cannot control your  anger, that can cause relationships problems at home and work. Anger can also affect your health. Uncontrolled anger increases your risk of heart disease. When you are angry, your heart rate and blood pressure rise. Levels of certain energy hormones, such as adrenaline, also increase. When this happens, your heart has to work harder. In extreme cases, anger can cause the blood vessels to become narrow. This reduces the supply of blood and oxygen to the heart, and that can trigger chest pain (angina). Anger can also trigger stress-related problems, such as:  Headaches.  Poor digestion.  Trouble sleeping (insomnia).  WHAT ACTIONS CAN I TAKE TO HELP MANAGE MY ANGER? You can take actions to help you manage your anger. For example:  Express your anger. When you express your anger in a healthy way, it is a form of communication. The following strategies can help you to express your anger in a healthy way and  when you are ready to do so: ? Step away. When you are feeling reactive, it may take at least 20 minutes for your body to return to its normal blood pressure and heart rate. To help your body do this, take a walk, listen to music, stretch, take deep breaths, and avoid the situation or person who is making you angry. Try to only discuss your anger when you feel calm again. ? Try to consider how others feel before you react. Avoid swearing, sighing, raising your voice, or blaming. ? Choose a good time to work through problems. You may be more likely to lose your temper at the end of the day when you are tired. ? Keep an anger journal. Writing down the situations that make you angry can help you figure out what triggers your anger and why.  Consider changing your perception. Is there another way you can view the situation that will leave you with a different emotion? Sometimes, changing the way you think about a situation can make it seem less infuriating. Here are some ways to do that: ? Remind yourself that everyone is not out to get you. ? Remind yourself that a disappointing result is not the end of the world. ? Take steps to solve or prevent the situation that upsets you. ? Find the humor in an aggravating situation. ? Deal with the physical effects by taking deep breaths, exercising, or taking a walk. ? Slowly repeat the word "relax" or another calming phrase. ? Picture a relaxing image in your mind. Close your eyes and use that image to help you calm yourself.  WHEN SHOULD I SEEK ADDITIONAL HELP? Anger becomes a problem if it occurs frequently and lasts for long periods of time. You may also need help managing your anger if:  You use physical force or aggression when you are angry and others feel threatened and fearful.  You feel that your anger is out of control.  Anger is interfering with your job.  Anger is causing problems with your health.  Anger is causing problems with your  relationships.  Anger is affecting your ability to tolerate normal daily situations, such as sitting in a traffic jam or waiting in line.  You treat others disrespectfully.  You do not trust people around you.  It may help to ask someone you trust whether he or she thinks you show any of these signs. Sometimes, it can be hard to recognize the problem yourself. WHERE CAN I GET SUPPORT? A psychologist or another licensed mental health professional can help you learn  how to manage your anger. Ask your health care provider for a referral, or look online to find a psychologist who specializes in anger management. You can search the websites of many mental health organizations to find a mental health care provider. Local Domestic Abuse Projects are also available for help. Your local hospital or behavioral counselors in your area may also offer anger management programs or support groups that can help. WHERE CAN I FIND MORE INFORMATION?  The [place] Centers for Disease Control and Prevention: VisualTrips.hu  American Psychological Association: https://www.jennings-kim.com/  The Substance Abuse and Mental Health Services Administration: EpicRoom.pl  [employer] on Domestic Violence: ChurchReunion.co.uk  This information is not intended to replace advice given to you by your health care provider. Make sure you discuss any questions you have with your health care provider. Document Released: 04/22/2007 Document Revised: 02/26/2016 Document Reviewed: 04/29/2015 Elsevier Interactive Patient Education  2018 [employer]. Living With Anxiety After being diagnosed with an anxiety disorder, you may be relieved to know why you have felt or behaved a certain way. It is natural to also feel overwhelmed about the treatment ahead and what it will mean for your life. With care and support, you can manage  this condition and recover from it. How to cope with anxiety Dealing with stress Stress is your body's reaction to life changes and events, both good and bad. Stress can last just a few hours or it can be ongoing. Stress can play a major role in anxiety, so it is important to learn both how to cope with stress and how to think about it differently. Talk with your health care provider or a counselor to learn more about stress reduction. He or she may suggest some stress reduction techniques, such as:  Music therapy. This can include creating or listening to music that you enjoy and that inspires you.  Mindfulness-based meditation. This involves being aware of your normal breaths, rather than trying to control your breathing. It can be done while sitting or walking.  Centering prayer. This is a kind of meditation that involves focusing on a word, phrase, or sacred image that is meaningful to you and that brings you peace.  Deep breathing. To do this, expand your stomach and inhale slowly through your nose. Hold your breath for 3-5 seconds. Then exhale slowly, allowing your stomach muscles to relax.  Self-talk. This is a skill where you identify thought patterns that lead to anxiety reactions and correct those thoughts.  Muscle relaxation. This involves tensing muscles then relaxing them.  Choose a stress reduction technique that fits your lifestyle and personality. Stress reduction techniques take time and practice. Set aside 5-15 minutes a day to do them. Therapists can offer training in these techniques. The training may be covered by some insurance plans. Other things you can do to manage stress include:  Keeping a stress diary. This can help you learn what triggers your stress and ways to control your response.  Thinking about how you respond to certain situations. You may not be able to control everything, but you can control your reaction.  Making time for activities that help you relax,  and not feeling guilty about spending your time in this way.  Therapy combined with coping and stress-reduction skills provides the best chance for successful treatment. Medicines Medicines can help ease symptoms. Medicines for anxiety include:  Anti-anxiety drugs.  Antidepressants.  Beta-blockers.  Medicines may be used as the main treatment for anxiety disorder, along with therapy,  or if other treatments are not working. Medicines should be prescribed by a health care provider. Relationships Relationships can play a big part in helping you recover. Try to spend more time connecting with trusted friends and family members. Consider going to couples counseling, taking family education classes, or going to family therapy. Therapy can help you and others better understand the condition. How to recognize changes in your condition Everyone has a different response to treatment for anxiety. Recovery from anxiety happens when symptoms decrease and stop interfering with your daily activities at home or work. This may mean that you will start to:  Have better concentration and focus.  Sleep better.  Be less irritable.  Have more energy.  Have improved memory.  It is important to recognize when your condition is getting worse. Contact your health care provider if your symptoms interfere with home or work and you do not feel like your condition is improving. Where to find help and support: You can get help and support from these sources:  Self-help groups.  Online and [employer].  A trusted spiritual leader.  Couples counseling.  Family education classes.  Family therapy.  Follow these instructions at home:  Eat a healthy diet that includes plenty of vegetables, fruits, whole grains, low-fat dairy products, and lean protein. Do not eat a lot of foods that are high in solid fats, added sugars, or salt.  Exercise. Most adults should do the following: ? Exercise for  at least 150 minutes each week. The exercise should increase your heart rate and make you sweat (moderate-intensity exercise). ? Strengthening exercises at least twice a week.  Cut down on caffeine, tobacco, alcohol, and other potentially harmful substances.  Get the right amount and quality of sleep. Most adults need 7-9 hours of sleep each night.  Make choices that simplify your life.  Take over-the-counter and prescription medicines only as told by your health care provider.  Avoid caffeine, alcohol, and certain over-the-counter cold medicines. These may make you feel worse. Ask your pharmacist which medicines to avoid.  Keep all follow-up visits as told by your health care provider. This is important. Questions to ask your health care provider  Would I benefit from therapy?  How often should I follow up with a health care provider?  How long do I need to take medicine?  Are there any long-term side effects of my medicine?  Are there any alternatives to taking medicine? Contact a health care provider if:  You have a hard time staying focused or finishing daily tasks.  You spend many hours a day feeling worried about everyday life.  You become exhausted by worry.  You start to have headaches, feel tense, or have nausea.  You urinate more than normal.  You have diarrhea. Get help right away if:  You have a racing heart and shortness of breath.  You have thoughts of hurting yourself or others. If you ever feel like you may hurt yourself or others, or have thoughts about taking your own life, get help right away. You can go to your nearest emergency department or call:  Your local emergency services (911 in the [place]  A suicide crisis helpline, such as the National Suicide Prevention Lifeline at 540-597-7535. This is open 24-hours a day.  Summary  Taking steps to deal with stress can help calm you.  Medicines cannot cure anxiety disorders, but they can help ease  symptoms.  Family, friends, and partners can play a big part in  helping you recover from an anxiety disorder. This information is not intended to replace advice given to you by your health care provider. Make sure you discuss any questions you have with your health care provider. Document Released: 06/19/2016 Document Revised: 06/19/2016 Document Reviewed: 06/19/2016 Elsevier Interactive Patient Education  [employer].

## 2018-05-14 LAB — TSH: TSH: 0.834 u[IU]/mL (ref 0.450–4.500)

## 2018-05-16 ENCOUNTER — Telehealth: Payer: Self-pay

## 2018-05-16 DIAGNOSIS — F320 Major depressive disorder, single episode, mild: Secondary | ICD-10-CM

## 2018-05-16 DIAGNOSIS — R454 Irritability and anger: Secondary | ICD-10-CM

## 2018-05-16 DIAGNOSIS — F411 Generalized anxiety disorder: Secondary | ICD-10-CM

## 2018-05-16 MED ORDER — FLUOXETINE HCL 20 MG PO CAPS: 20.0000 mg | ORAL_CAPSULE | Freq: Every day | ORAL | 0 refills | Status: DC

## 2018-05-16 NOTE — Telephone Encounter (Signed)
Medicaid non preferred Fluoxetine HCL tablets   Fluoxetine capsules are preferred

## 2018-05-16 NOTE — Telephone Encounter (Signed)
I sent a new prescription of the one that should be covered, why do they not just substitute as permitted.

## 2018-05-27 ENCOUNTER — Ambulatory Visit: Payer: Medicaid Other | Admitting: Family Medicine

## 2018-05-28 ENCOUNTER — Encounter: Payer: Self-pay | Admitting: Family Medicine

## 2018-06-23 ENCOUNTER — Encounter: Payer: Self-pay | Admitting: Family Medicine

## 2018-06-23 ENCOUNTER — Ambulatory Visit: Payer: Medicaid Other | Admitting: Family Medicine

## 2018-06-23 VITALS — BP 122/82 | HR 77 | Temp 98.5°F | Ht 75.0 in | Wt 295.0 lb

## 2018-06-23 DIAGNOSIS — F411 Generalized anxiety disorder: Secondary | ICD-10-CM

## 2018-06-23 DIAGNOSIS — S60122A Contusion of left index finger with damage to nail, initial encounter: Secondary | ICD-10-CM | POA: Diagnosis not present

## 2018-06-23 DIAGNOSIS — S6010XA Contusion of unspecified finger with damage to nail, initial encounter: Secondary | ICD-10-CM

## 2018-06-23 MED ORDER — FLUOXETINE HCL 40 MG PO CAPS: 40.0000 mg | ORAL_CAPSULE | Freq: Every day | ORAL | 3 refills | Status: DC

## 2018-06-23 NOTE — Patient Instructions (Signed)

## 2018-06-23 NOTE — Progress Notes (Signed)
Subjective:    Patient ID: Alex Callahan, male    DOB: 1990/12/14, 27 y.o.   MRN: [id number]  Chief Complaint:  Followup anger (took Prozac,felt like it was not strong enough; out of med x 3 days) and Pain in left index finger (hit while working on car)   HPI: Alex Callahan is a 27 y.o. male presenting on 06/23/2018 for Followup anger (took Prozac,felt like it was not strong enough; out of med x 3 days) and Pain in left index finger (hit while working on car)  Pt presents today for anxiety follow up and left index finger injury.  Pt started Prozac on 05/13/18 for his anxiety and anger. States he has noticed a big improvement in symptoms but states he still has some anger outbursts and anxiety. He reports compliance with the medications and denies side effects. Pt states he feels a dose increase may be warranted.  GAD 7 : Generalized Anxiety Score 06/23/2018 05/13/2018  Nervous, Anxious, on Edge 2 3  Control/stop worrying 0 2  Worry too much - different things 1 2  Trouble relaxing 1 1  Restless 0 2  Easily annoyed or irritable 2 3  Afraid - awful might happen 0 0  Total GAD 7 Score 6 13  Anxiety Difficulty Somewhat difficult Very difficult     Office Visit from 06/23/2018 in [facility]  PHQ-9 Total Score  3     Pt also reports smashing his left index finger while working on a car. States this happened several days ago. States he has developed a bruise under his fingernail. States he tried to relieve this at home but was unable. States his finger is throbbing and has a lot of pressure under the nail. Denies loss of function or sensation.   Relevant past medical, surgical, family, and social history reviewed and updated as indicated.  Allergies and medications reviewed and updated.   History reviewed. No pertinent past medical history.  Past Surgical History:  Procedure Laterality Date  . WISDOM TOOTH EXTRACTION      Social History   Socioeconomic  History  . Marital status: Single    Spouse name: Not on file  . Number of children: Not on file  . Years of education: Not on file  . Highest education level: Not on file  Occupational History  . Not on file  Social Needs  . Financial resource strain: Not on file  . Food insecurity:    Worry: Not on file    Inability: Not on file  . Transportation needs:    Medical: Not on file    Non-medical: Not on file  Tobacco Use  . Smoking status: Current Some Day Smoker    Packs/day: 0.25    Types: Cigarettes    Start date: 05/12/2001  . Smokeless tobacco: Former Neurosurgeon    Types: Chew  Substance and Sexual Activity  . Alcohol use: Not Currently    Comment: rare  . Drug use: No  . Sexual activity: Not on file  Lifestyle  . Physical activity:    Days per week: Not on file    Minutes per session: Not on file  . Stress: Not on file  Relationships  . Social connections:    Talks on phone: Not on file    Gets together: Not on file    Attends religious service: Not on file    Active member of club or organization: Not on file    Attends  meetings of clubs or organizations: Not on file    Relationship status: Not on file  . Intimate partner violence:    Fear of current or ex partner: Not on file    Emotionally abused: Not on file    Physically abused: Not on file    Forced sexual activity: Not on file  Other Topics Concern  . Not on file  Social History Narrative  . Not on file    Outpatient Encounter Medications as of 06/23/2018  Medication Sig  . [DISCONTINUED] FLUoxetine (PROZAC) 20 MG capsule Take 1 capsule (20 mg total) by mouth daily.  Marland Kitchen FLUoxetine (PROZAC) 40 MG capsule Take 1 capsule (40 mg total) by mouth daily.   No facility-administered encounter medications on file as of 06/23/2018.     No Known Allergies  Review of Systems  Constitutional: Negative for activity change, chills, fatigue and fever.  Respiratory: Negative for shortness of breath.   Cardiovascular:  Negative for chest pain and palpitations.  Gastrointestinal: Negative for constipation, diarrhea, nausea and vomiting.  Genitourinary: Negative.   Skin: Positive for wound (hematoma to left index finger ).  Neurological: Negative for weakness and headaches.  Psychiatric/Behavioral: Positive for agitation and behavioral problems. Negative for confusion, decreased concentration, hallucinations, self-injury, sleep disturbance and suicidal ideas. The patient is nervous/anxious. The patient is not hyperactive.   All other systems reviewed and are negative.       Objective:    BP 122/82   Pulse 77   Temp 98.5 F (36.9 C) (Oral)   Ht 6\' 3"  (1.905 m)   Wt 295 lb (133.8 kg)   BMI 36.87 kg/m    Wt Readings from Last 3 Encounters:  06/23/18 295 lb (133.8 kg)  05/13/18 295 lb (133.8 kg)  01/30/17 279 lb (126.6 kg)    Physical Exam Vitals signs and nursing note reviewed.  Constitutional:      Appearance: He is not ill-appearing or toxic-appearing.     Comments: Unkept, not well groomed.  HENT:     Head: Normocephalic and atraumatic.     Mouth/Throat:     Lips: Pink.     Mouth: Mucous membranes are moist.     Pharynx: Oropharynx is clear.  Eyes:     General: Lids are normal.     Conjunctiva/sclera: Conjunctivae normal.  Cardiovascular:     Rate and Rhythm: Normal rate and regular rhythm.     Heart sounds: Normal heart sounds, S1 normal and S2 normal. No murmur. No friction rub. No gallop.   Pulmonary:     Effort: Pulmonary effort is normal.     Breath sounds: Normal breath sounds.  Musculoskeletal:     Left hand: He exhibits tenderness. He exhibits normal range of motion, no bony tenderness, normal two-point discrimination, normal capillary refill, no deformity, no laceration and no swelling. Normal sensation noted. Normal strength noted.       Hands:  Skin:    General: Skin is warm and dry.     Capillary Refill: Capillary refill takes less than 2 seconds.  Neurological:      Mental Status: He is alert and oriented to person, place, and time.     Sensory: Sensation is intact.     Motor: Motor function is intact.  Psychiatric:        Attention and Perception: Attention and perception normal.        Mood and Affect: Mood is anxious.        Speech: Speech normal.  Behavior: Behavior normal. Behavior is cooperative.        Thought Content: Thought content normal. Thought content does not include homicidal or suicidal ideation. Thought content does not include homicidal or suicidal plan.        Cognition and Memory: Cognition and memory normal.        Judgment: Judgment normal.     Results for orders placed or performed in visit on 05/13/18  TSH  Result Value Ref Range   TSH 0.834 0.450 - 4.500 uIU/mL       Pertinent labs & imaging results that were available during my care of the patient were reviewed by me and considered in my medical decision making.  Assessment & Plan:  Alex Callahan was seen today for followup anger and pain in left index finger.  Diagnoses and all orders for this visit:  Generalized anxiety disorder Stress management. Declined therapy. Medications as prescribed. Report any new or worsening symptoms.  -     FLUoxetine (PROZAC) 40 MG capsule; Take 1 capsule (40 mg total) by mouth daily.  Subungual hematoma of finger of left hand, initial encounter Nail trephination with electric cautery in office with immediate return of blood and instant relief of pressure and throbbing. Wound care discussed.     Continue all other maintenance medications.  Follow up plan: Return in about 4 weeks (around 07/21/2018), or if symptoms worsen or fail to improve.  Educational handout given for GAD  The above assessment and management plan was discussed with the patient. The patient verbalized understanding of and has agreed to the management plan. Patient is aware to call the clinic if symptoms persist or worsen. Patient is aware when to return to the  clinic for a follow-up visit. Patient educated on when it is appropriate to go to the emergency department.   Kari BaarsMichelle Hetvi Shawhan, FNP-C [facility] [phone]

## 2019-04-24 ENCOUNTER — Ambulatory Visit (INDEPENDENT_AMBULATORY_CARE_PROVIDER_SITE_OTHER): Payer: Medicaid Other | Admitting: Family Medicine

## 2019-04-24 ENCOUNTER — Emergency Department (HOSPITAL_COMMUNITY)
Admission: EM | Admit: 2019-04-24 | Discharge: 2019-04-24 | Disposition: A | Discharge status: Home / Self Care | Payer: Medicaid Other | Attending: Emergency Medicine | Admitting: Emergency Medicine

## 2019-04-24 ENCOUNTER — Encounter: Payer: Self-pay | Admitting: Family Medicine

## 2019-04-24 ENCOUNTER — Encounter (HOSPITAL_COMMUNITY): Payer: Self-pay | Admitting: Emergency Medicine

## 2019-04-24 ENCOUNTER — Telehealth: Payer: Self-pay | Admitting: Family Medicine

## 2019-04-24 DIAGNOSIS — F411 Generalized anxiety disorder: Secondary | ICD-10-CM | POA: Diagnosis not present

## 2019-04-24 DIAGNOSIS — IMO0002 Reserved for concepts with insufficient information to code with codable children: Secondary | ICD-10-CM | POA: Insufficient documentation

## 2019-04-24 DIAGNOSIS — Z915 Personal history of self-harm: Secondary | ICD-10-CM | POA: Diagnosis not present

## 2019-04-24 DIAGNOSIS — Z046 Encounter for general psychiatric examination, requested by authority: Secondary | ICD-10-CM | POA: Diagnosis not present

## 2019-04-24 DIAGNOSIS — F419 Anxiety disorder, unspecified: Secondary | ICD-10-CM | POA: Diagnosis not present

## 2019-04-24 DIAGNOSIS — R454 Irritability and anger: Secondary | ICD-10-CM | POA: Insufficient documentation

## 2019-04-24 DIAGNOSIS — F17210 Nicotine dependence, cigarettes, uncomplicated: Secondary | ICD-10-CM | POA: Insufficient documentation

## 2019-04-24 DIAGNOSIS — R440 Auditory hallucinations: Secondary | ICD-10-CM | POA: Insufficient documentation

## 2019-04-24 DIAGNOSIS — F329 Major depressive disorder, single episode, unspecified: Secondary | ICD-10-CM | POA: Insufficient documentation

## 2019-04-24 DIAGNOSIS — Z79899 Other long term (current) drug therapy: Secondary | ICD-10-CM | POA: Insufficient documentation

## 2019-04-24 HISTORY — DX: Anxiety disorder, unspecified: F41.9

## 2019-04-24 LAB — COMPREHENSIVE METABOLIC PANEL
ALT: 28 U/L (ref 0–44)
AST: 28 U/L (ref 15–41)
Albumin: 4.5 g/dL (ref 3.5–5.0)
Alkaline Phosphatase: 66 U/L (ref 38–126)
Anion gap: 11 (ref 5–15)
BUN: 10 mg/dL (ref 6–20)
CO2: 23 mmol/L (ref 22–32)
Calcium: 9.3 mg/dL (ref 8.9–10.3)
Chloride: 101 mmol/L (ref 98–111)
Creatinine, Ser: 0.98 mg/dL (ref 0.61–1.24)
GFR calc Af Amer: >60 mL/min (ref >60)
GFR calc non Af Amer: >60 mL/min (ref >60)
Glucose, Bld: 92 mg/dL (ref 70–99)
Potassium: 4 mmol/L (ref 3.5–5.1)
Sodium: 135 mmol/L (ref 135–145)
Total Bilirubin: 0.8 mg/dL (ref 0.3–1.2)
Total Protein: 7.5 g/dL (ref 6.5–8.1)

## 2019-04-24 LAB — RAPID URINE DRUG SCREEN, HOSP PERFORMED
Amphetamines: NOT DETECTED
Barbiturates: NOT DETECTED
Benzodiazepines: NOT DETECTED
Cocaine: NOT DETECTED
Opiates: NOT DETECTED
Tetrahydrocannabinol: NOT DETECTED

## 2019-04-24 LAB — CBC
HCT: 46.1 % (ref 39.0–52.0)
Hemoglobin: 15.5 g/dL (ref 13.0–17.0)
MCH: 29.5 pg (ref 26.0–34.0)
MCHC: 33.6 g/dL (ref 30.0–36.0)
MCV: 87.8 fL (ref 80.0–100.0)
Platelets: 337 10*3/uL (ref 150–400)
RBC: 5.25 MIL/uL (ref 4.22–5.81)
RDW: 13.1 % (ref 11.5–15.5)
WBC: 10.5 10*3/uL (ref 4.0–10.5)
nRBC: 0 % (ref 0.0–0.2)

## 2019-04-24 LAB — SALICYLATE LEVEL: Salicylate Lvl: <7 mg/dL (ref 2.8–30.0)

## 2019-04-24 LAB — ETHANOL: Alcohol, Ethyl (B): <10 mg/dL (ref <10)

## 2019-04-24 LAB — ACETAMINOPHEN LEVEL: Acetaminophen (Tylenol), Serum: <10 ug/mL — ABNORMAL LOW (ref 10–30)

## 2019-04-24 MED ORDER — HYDROXYZINE HCL 25 MG PO TABS: 25.0000 mg | ORAL_TABLET | Freq: Three times a day (TID) | ORAL | 0 refills | Status: DC | PRN

## 2019-04-24 NOTE — Patient Instructions (Signed)
If your symptoms worsen or you have thoughts of suicide/homicide, PLEASE SEEK IMMEDIATE MEDICAL ATTENTION.  You may always call the National Suicide Hotline.  This is available 24 hours a day, 7 days a week.  Their number is: 1-800-273-8255  Taking the medicine as directed and not missing any doses is one of the best things you can do to treat your depression.  Here are some things to keep in mind:  1) Side effects (stomach upset, some increased anxiety) may happen before you notice a benefit.  These side effects typically go away over time. 2) Changes to your dose of medicine or a change in medication all together is sometimes necessary 3) Most people need to be on medication at least 12 months 4) Many people will notice an improvement within two weeks but the full effect of the medication can take up to 4-6 weeks 5) Stopping the medication when you start feeling better often results in a return of symptoms 6) Never discontinue your medication without contacting a health care professional first.  Some medications require gradual discontinuation/ taper and can make you sick if you stop them abruptly.  If your symptoms worsen or you have thoughts of suicide/homicide, PLEASE SEEK IMMEDIATE MEDICAL ATTENTION.  You may always call:  National Suicide Hotline: 800-273-8255 [facility] Crisis Line: 336-832-9700 Crisis Recovery in [place]: 800-939-5911   These are available 24 hours a day, 7 days a week.   

## 2019-04-24 NOTE — BH Assessment (Signed)
Assessment Note  Alex Callahan is an 28 y.o. male that presents this date requesting OP referrals for anxiety and anger management issues. Patient was seen virtually by Rakes FNP at [facility] where he has been treated for anxiety. Per that note by Rakes FNP this date, "Pt presents today for follow up for anxiety and anger. Pt has not been seen in office since 05/13/2018. At this time his fluoxetine was increased to 40 mg daily for ongoing anxiety, anger, and slight depression. Pt states he has continued to experience explosive anger and took it upon himself to increase the fluoxetine to 80 mg daily. Pt states he did not see improvement at this dose so he stopped the medication all together about 2 weeks ago. States he is now very explosive at home and at work. States he feels his wife is going to leave with his children due to his anger and anxiety. Patient denied S/I or H/I." Patient was referred to [facility] to assist with referrals. Patient was requesting to see a counselor. This Probation officer met with patient and discussed services that patient was in need of. Patient denies any S/I, H/I or AVH. Patient voiced some AH on arrival stating he "was hearing someone call his name" although when this writer defined criteria for [facility] patient stated he felt it was just "self talk." Per notes this date patient has a history of general anxiety disorder, history of self-harm, and depression presenting for evaluation of anxiety. Patient states he has had trouble with anger issues throughout his life. His primary care doctor prescribed an antidepressant medication that he has been taking for the past several months but noticed no improvement of his symptoms. Reach out to his doctor for recommendation but was told to come to the ER to talk to a counselor. Patient states symptom has been an ongoing issue for many years. He denies any SI or HI. He does endorse some occasional auditory hallucination and admits to  talking to himself but denies any visual sedation. He denies alcohol abuse or polysubstance use. He does smoke cigarettes approximately a pack of cigarette every other day. He used to self cut but states that he was able to control and have not cut for "quite a while". He has not been evaluated by therapist yet. He has been taking his medication as prescribed and sometimes does double up the dose but noticed no improvement. Patient denies any history of S/I or H/I. Patient denies any previous attempts or gestures at self harm. Patient is requesting OP referrals this date to assist with counseling for developing coping mechanisms to better deal with anger management. Case was staffed with Reita Cliche DNP who recommended patient be provided with resources and discharged. Case was also staffed with provider Gertie Fey PA who agreed to discharge. Patient was provided with multiple OP resources in the area and discussed treatment interventions associated with different styles of counseling. Patient voiced understanding of information presented and contacted for safety. Patient states he will follow up with OP provider on Monday 04/27/19.       Diagnosis: GAD  Past Medical History:  Past Medical History:  Diagnosis Date  . Anxiety     Past Surgical History:  Procedure Laterality Date  . WISDOM TOOTH EXTRACTION      Family History:  Family History  Problem Relation Age of Onset  . Heart disease Mother   . Hypertension Mother   . Diabetes Mother   . Cancer Father  melanoma    Social History:  reports that he has been smoking cigarettes. He started smoking about 17 years ago. He has been smoking about 0.25 packs per day. He has quit using smokeless tobacco.  His smokeless tobacco use included chew. He reports previous alcohol use. He reports that he does not use drugs.  Additional Social History:  Alcohol / Drug Use Pain Medications: See MAR Prescriptions: See MAR Over the Counter: See MAR History of  alcohol / drug use?: No history of alcohol / drug abuse  CIWA: CIWA-Ar BP: 122/81 Pulse Rate: 90 COWS:    Allergies: No Known Allergies  Home Medications: (Not in a hospital admission)   OB/GYN Status:  No LMP for male patient.  General Assessment Data Location of Assessment: [facility] ED TTS Assessment: In system Is this a Tele or Face-to-Face Assessment?: Face-to-Face Is this an Initial Assessment or a Re-assessment for this encounter?: Initial Assessment Patient Accompanied by:: N/A Language Other than English: No Living Arrangements: Other (Comment) What gender do you identify as?: Male Marital status: Married Living Arrangements: Spouse/significant other Can pt return to current living arrangement?: Yes Admission Status: Voluntary Is patient capable of signing voluntary admission?: Yes Referral Source: Self/Family/Friend Insurance type: Medicaid  Medical Screening Exam (Delia) Medical Exam completed: Yes  Crisis Care Plan Living Arrangements: Spouse/significant other Legal Guardian: (NA) Name of Psychiatrist: None Name of Therapist: None  Education Status Is patient currently in school?: No Is the patient employed, unemployed or receiving disability?: Receiving disability income  Risk to self with the past 6 months Suicidal Ideation: No Has patient been a risk to self within the past 6 months prior to admission? : No Suicidal Intent: No Has patient had any suicidal intent within the past 6 months prior to admission? : No Is patient at risk for suicide?: No, but patient needs Medical Clearance Suicidal Plan?: No Has patient had any suicidal plan within the past 6 months prior to admission? : No Access to Means: No What has been your use of drugs/alcohol within the last 12 months?: Denies Previous Attempts/Gestures: No How many times?: 0 Other Self Harm Risks: (NA) Triggers for Past Attempts: (NA) Intentional Self Injurious Behavior: None Family  Suicide History: No Recent stressful life event(s): Other (Comment)(Excessive anxiety) Persecutory voices/beliefs?: No Depression: No Depression Symptoms: (Denies) Substance abuse history and/or treatment for substance abuse?: No Suicide prevention information given to non-admitted patients: Not applicable  Risk to Others within the past 6 months Homicidal Ideation: No Does patient have any lifetime risk of violence toward others beyond the six months prior to admission? : No Thoughts of Harm to Others: No Current Homicidal Intent: No Current Homicidal Plan: No Access to Homicidal Means: No Identified Victim: NA History of harm to others?: No Assessment of Violence: None Noted Violent Behavior Description: NA Does patient have access to weapons?: No Criminal Charges Pending?: No Does patient have a court date: No Is patient on probation?: No  Psychosis Hallucinations: None noted Delusions: None noted  Mental Status Report Appearance/Hygiene: Unremarkable Eye Contact: Good Motor Activity: Freedom of movement Speech: Logical/coherent Level of Consciousness: Alert Mood: Pleasant Affect: Appropriate to circumstance Anxiety Level: Minimal Thought Processes: Coherent, Relevant Judgement: Unimpaired Orientation: Person, Place, Time Obsessive Compulsive Thoughts/Behaviors: None  Cognitive Functioning Concentration: Normal Memory: Recent Intact, Remote Intact Is patient IDD: No Insight: Good Impulse Control: Good Appetite: Good Have you had any weight changes? : No Change Sleep: No Change Total Hours of Sleep: 7 Vegetative Symptoms: None  ADLScreening [facility] Assessment Services) Patient's cognitive ability adequate to safely complete daily activities?: Yes Patient able to express need for assistance with ADLs?: Yes Independently performs ADLs?: Yes (appropriate for developmental age)  Prior Inpatient Therapy Prior Inpatient Therapy: No  Prior Outpatient  Therapy Prior Outpatient Therapy: No Does patient have an ACCT team?: No Does patient have Intensive In-House Services?  : No Does patient have [facility] services? : No Does patient have P4CC services?: No  ADL Screening (condition at time of admission) Patient's cognitive ability adequate to safely complete daily activities?: Yes Is the patient deaf or have difficulty hearing?: No Does the patient have difficulty seeing, even when wearing glasses/contacts?: No Does the patient have difficulty concentrating, remembering, or making decisions?: No Patient able to express need for assistance with ADLs?: Yes Does the patient have difficulty dressing or bathing?: No Independently performs ADLs?: Yes (appropriate for developmental age) Does the patient have difficulty walking or climbing stairs?: No Weakness of Legs: None Weakness of Arms/Hands: None  Home Assistive Devices/Equipment Home Assistive Devices/Equipment: None  Therapy Consults (therapy consults require a physician order) PT Evaluation Needed: No OT Evalulation Needed: No SLP Evaluation Needed: No Abuse/Neglect Assessment (Assessment to be complete while patient is alone) Abuse/Neglect Assessment Can Be Completed: Yes Physical Abuse: Denies Verbal Abuse: Denies Sexual Abuse: Denies Exploitation of patient/patient's resources: Denies Self-Neglect: Denies Values / Beliefs Cultural Requests During Hospitalization: None Spiritual Requests During Hospitalization: None Consults Spiritual Care Consult Needed: No Social Work Consult Needed: No Regulatory affairs officer (For Healthcare) Does Patient Have a Medical Advance Directive?: No Would patient like information on creating a medical advance directive?: No - Patient declined          Disposition: Case was staffed with Reita Cliche DNP who recommended patient be provided with resources and discharged. Case was also staffed with provider Gertie Fey PA who agreed to discharge. Patient was  provided with multiple OP resources in the area and discussed treatment interventions associated with different styles of counseling. Patient voiced understanding of information presented and contacted for safety. Patient states he will follow up with OP provider on Monday 04/27/19.       Disposition Initial Assessment Completed for this Encounter: Yes Disposition of Patient: Discharge Patient refused recommended treatment: No Mode of transportation if patient is discharged/movement?: Car Patient referred to: Outpatient clinic referral  On Site Evaluation by:   Reviewed with Physician:    Mamie Nick 04/24/2019 5:12 PM

## 2019-04-24 NOTE — ED Notes (Signed)
Patient Alert and oriented to baseline. Stable and ambulatory to baseline. Patient verbalized understanding of the discharge instructions.  Patient belongings were taken by the patient.   

## 2019-04-24 NOTE — Progress Notes (Signed)
Virtual Visit via telephone Note Due to COVID-19 pandemic this visit was conducted virtually. This visit type was conducted due to national recommendations for restrictions regarding the COVID-19 Pandemic (e.g. social distancing, sheltering in place) in an effort to limit this patient's exposure and mitigate transmission in our community. All issues noted in this document were discussed and addressed.  A physical exam was not performed with this format.   I connected with [name] on 04/24/19 at 1050 by telephone and verified that I am speaking with the correct person using two identifiers. [name] is currently located at home and family is currently with them during visit. The provider, Monia Pouch, FNP is located in their office at time of visit.  I discussed the limitations, risks, security and privacy concerns of performing an evaluation and management service by telephone and the availability of in person appointments. I also discussed with the patient that there may be a patient responsible charge related to this service. The patient expressed understanding and agreed to proceed.  Subjective:  Patient ID: Alex Callahan, male    DOB: February 25, 1991, 28 y.o.   MRN: [id number]  Chief Complaint:  Anxiety and Anger   HPI: Alex Callahan is a 28 y.o. male presenting on 04/24/2019 for Anxiety and Anger   Pt presents today for follow up for anxiety and anger. Pt has not been seen in office since 05/13/2018. At this time his fluoxetine was increased to 40 mg daily for ongoing anxiety, anger, and slight depression. Pt states he has continued to experience explosive anger and took it upon himself to increase the fluoxetine to 80 mg daily. Pt states he did not see improvement at this dose so he stopped the medication all together about 2 weeks ago. States he is now very explosive at home and at work. States he feels his wife is going to leave with his children due to his anger and anxiety.     Pt reports he has issues with this several years ago and did not seek treatment because he felt it would go away. Pt states around the age of 56 he started hearing voices. States he is unsure of what the voices are saying but he knows he hears them. States he used to cut himself to relief his anger and anxiety. He has never felt suicidal or homicidal. States he would cut himself for a release of emotions.   He states he continues to hear the voices. States they will call his name. Denies any specific demands from the voices. At previous visits pt has declined referral to psychiatry. Pt is now willing to see psychiatry for management of his symptoms.   Pt denies SI or HI. No self mutilation in several years per pt report.   Anxiety Presents for follow-up visit. Symptoms include compulsions, decreased concentration, depressed mood, excessive worry, insomnia, irritability, muscle tension, nervous/anxious behavior, obsessions, palpitations, panic and restlessness. Patient reports no chest pain, confusion, dizziness, dry mouth, feeling of choking, hyperventilation, impotence, malaise, nausea, shortness of breath or suicidal ideas. Symptoms occur constantly. The severity of symptoms is severe, causing significant distress and interfering with daily activities. The quality of sleep is fair. Nighttime awakenings: several.   Compliance with medications is 0-25%.   Depression screen [facility] 2/9 04/24/2019 06/23/2018 05/13/2018 12/18/2016 11/20/2013  Decreased Interest 2 0 0 1 0  Down, Depressed, Hopeless 1 2 1 3  0  PHQ - 2 Score 3 2 1 4  0  Altered sleeping 1 0 0 0 -  Tired, decreased energy 1 1 3 3  -  Change in appetite 0 0 3 1 -  Feeling bad or failure about yourself  0 0 3 2 -  Trouble concentrating 2 0 2 3 -  Moving slowly or fidgety/restless 0 0 2 0 -  Suicidal thoughts 0 0 0 0 -  PHQ-9 Score 7 3 14 13  -   GAD 7 : Generalized Anxiety Score 04/24/2019 06/23/2018 05/13/2018  Nervous, Anxious, on Edge 3  2 3   Control/stop worrying 3 0 2  Worry too much - different things 3 1 2   Trouble relaxing 3 1 1   Restless 1 0 2  Easily annoyed or irritable 3 2 3   Afraid - awful might happen 3 0 0  Total GAD 7 Score 19 6 13   Anxiety Difficulty - Somewhat difficult Very difficult      Relevant past medical, surgical, family, and social history reviewed and updated as indicated.  Allergies and medications reviewed and updated.   History reviewed. No pertinent past medical history.  Past Surgical History:  Procedure Laterality Date   WISDOM TOOTH EXTRACTION      Social History   Socioeconomic History   Marital status: Single    Spouse name: Not on file   Number of children: Not on file   Years of education: Not on file   Highest education level: Not on file  Occupational History   Not on file  Social Needs   Financial resource strain: Not on file   Food insecurity    Worry: Not on file    Inability: Not on file   Transportation needs    Medical: Not on file    Non-medical: Not on file  Tobacco Use   Smoking status: Current Some Day Smoker    Packs/day: 0.25    Types: Cigarettes    Start date: 05/12/2001   Smokeless tobacco: Former NeurosurgeonUser    Types: Chew  Substance and Sexual Activity   Alcohol use: Not Currently    Comment: rare   Drug use: No   Sexual activity: Not on file  Lifestyle   Physical activity    Days per week: Not on file    Minutes per session: Not on file   Stress: Not on file  Relationships   Social connections    Talks on phone: Not on file    Gets together: Not on file    Attends religious service: Not on file    Active member of club or organization: Not on file    Attends meetings of clubs or organizations: Not on file    Relationship status: Not on file   Intimate partner violence    Fear of current or ex partner: Not on file    Emotionally abused: Not on file    Physically abused: Not on file    Forced sexual activity: Not on  file  Other Topics Concern   Not on file  Social History Narrative   Not on file    Outpatient Encounter Medications as of 04/24/2019  Medication Sig   FLUoxetine (PROZAC) 40 MG capsule Take 1 capsule (40 mg total) by mouth daily.   No facility-administered encounter medications on file as of 04/24/2019.     No Known Allergies  Review of Systems  Constitutional: Positive for activity change, fatigue and irritability. Negative for appetite change, chills, diaphoresis, fever and unexpected weight change.  HENT: Negative.   Eyes: Negative.  Negative for photophobia and visual disturbance.  Respiratory: Negative for cough, chest tightness and shortness of breath.   Cardiovascular: Positive for palpitations. Negative for chest pain and leg swelling.  Gastrointestinal: Negative for abdominal pain, blood in stool, constipation, diarrhea, nausea and vomiting.  Endocrine: Negative.   Genitourinary: Negative for decreased urine volume, difficulty urinating, dysuria, frequency, impotence and urgency.  Musculoskeletal: Negative for arthralgias and myalgias.  Skin: Negative.   Allergic/Immunologic: Negative.   Neurological: Negative for dizziness, tremors, seizures, syncope, facial asymmetry, speech difficulty, weakness, light-headedness, numbness and headaches.  Hematological: Negative.   Psychiatric/Behavioral: Positive for agitation, behavioral problems, decreased concentration, dysphoric mood, hallucinations, self-injury (in the past, not in several years) and sleep disturbance. Negative for confusion and suicidal ideas. The patient is nervous/anxious and has insomnia. The patient is not hyperactive.   All other systems reviewed and are negative.        Observations/Objective: No vital signs or physical exam, this was a telephone or virtual health encounter.  Pt alert and oriented, answers all questions appropriately, and able to speak in full sentences.    Assessment and  Plan: Menachem was seen today for anxiety and anger.  Diagnoses and all orders for this visit:  Difficulty controlling anger Generalized anxiety disorder Auditory hallucination History of self-harm Urgent referral to psychiatry. Psychiatry would like pt to go to ED at [facility] for evaluation and possible admission. Pt aware and states he will go. Pt continues to deny current self harm thoughts, SI, or HI. Pt agrees to go to ED for evaluation and treatment. States he has to do this so his family will not leave him.  -     Ambulatory referral to Psychiatry     Follow Up Instructions: Return in about 1 week (around 05/01/2019), or if symptoms worsen or fail to improve.    I discussed the assessment and treatment plan with the patient. The patient was provided an opportunity to ask questions and all were answered. The patient agreed with the plan and demonstrated an understanding of the instructions.   The patient was advised to call back or seek an in-person evaluation if the symptoms worsen or if the condition fails to improve as anticipated.  The above assessment and management plan was discussed with the patient. The patient verbalized understanding of and has agreed to the management plan. Patient is aware to call the clinic if they develop any new symptoms or if symptoms persist or worsen. Patient is aware when to return to the clinic for a follow-up visit. Patient educated on when it is appropriate to go to the emergency department.    I provided 25 minutes of non-face-to-face time during this encounter. The call started at 1050. The call ended at 1115. The other time was used for coordination of care.    Kari Baars, FNP-C [facility] [address] [place], [place] [address] [phone] 04/24/19

## 2019-04-24 NOTE — ED Provider Notes (Signed)
[facility] EMERGENCY DEPARTMENT Provider Note   CSN: [id number] Arrival date & time: 04/24/19  1341     History   Chief Complaint Chief Complaint  Patient presents with   Medical Clearance    HPI Alex Callahan is a 28 y.o. male.     The history is provided by the patient. No language interpreter was used.     28 year old male with history of general anxiety disorder, history of self-harm, and depression presenting for evaluation of anxiety.  Patient states he has had trouble with anger issues throughout his life.  His primary care doctor prescribed an antidepressant medication that he has been taking for the past several months but noticed no improvement of his symptoms.  Reach out to his doctor for recommendation but was told to come to the ER to talk to a counselor.  Patient states symptom has been an ongoing issue for many years.  He denies any SI or HI.  He does endorse some occasional auditory hallucination and admits to talking to himself but denies any visual sedation.  He denies alcohol abuse or polysubstance use.  He does smoke cigarettes approximately a pack of cigarette every other day.  He used to self cut but states that he was able to control and have a diet for "quite a while".  He has not been evaluated by therapist yet.  He has been taking his medication as prescribed and sometimes does double up the dose but noticed no improvement.  Past Medical History:  Diagnosis Date   Anxiety     Patient Active Problem List   Diagnosis Date Noted   Auditory hallucination 04/24/2019   History of self-harm 04/24/2019   Current mild episode of major depressive disorder without prior episode (HCC) 05/13/2018   Difficulty controlling anger 05/13/2018   Generalized anxiety disorder 05/13/2018   Cholelithiasis 12/18/2016    Past Surgical History:  Procedure Laterality Date   WISDOM TOOTH EXTRACTION          Home Medications    Prior to  Admission medications   Medication Sig Start Date End Date Taking? Authorizing Provider  FLUoxetine (PROZAC) 40 MG capsule Take 1 capsule (40 mg total) by mouth daily. 06/23/18   RakesDoralee Albino, FNP    Family History Family History  Problem Relation Age of Onset   Heart disease Mother    Hypertension Mother    Diabetes Mother    Cancer Father        melanoma    Social History Social History   Tobacco Use   Smoking status: Current Some Day Smoker    Packs/day: 0.25    Types: Cigarettes    Start date: 05/12/2001   Smokeless tobacco: Former Neurosurgeon    Types: Chew  Substance Use Topics   Alcohol use: Not Currently    Comment: rare   Drug use: No     Allergies   Patient has no known allergies.   Review of Systems Review of Systems  All other systems reviewed and are negative.    Physical Exam Updated Vital Signs BP 117/78 (BP Location: Right Arm)    Pulse 95    Temp 98.1 F (36.7 C) (Oral)    Resp 18    SpO2 97%   Physical Exam Vitals signs and nursing note reviewed.  Constitutional:      General: He is not in acute distress.    Appearance: He is well-developed.  HENT:     Head: Atraumatic.  Eyes:     Conjunctiva/sclera: Conjunctivae normal.  Neck:     Musculoskeletal: Neck supple.  Cardiovascular:     Rate and Rhythm: Normal rate and regular rhythm.     Pulses: Normal pulses.     Heart sounds: Normal heart sounds.  Pulmonary:     Effort: Pulmonary effort is normal.     Breath sounds: Normal breath sounds.  Abdominal:     Palpations: Abdomen is soft.     Tenderness: There is no abdominal tenderness.  Skin:    Findings: No rash.  Neurological:     Mental Status: He is alert and oriented to person, place, and time.     GCS: GCS eye subscore is 4. GCS verbal subscore is 5. GCS motor subscore is 6.  Psychiatric:        Attention and Perception: Attention normal.        Mood and Affect: Mood normal.        Speech: Speech normal.        Behavior:  Behavior is cooperative.        Thought Content: Thought content is not paranoid. Thought content does not include homicidal or suicidal ideation.      ED Treatments / Results  Labs (all labs ordered are listed, but only abnormal results are displayed) Labs Reviewed  ACETAMINOPHEN LEVEL - Abnormal; Notable for the following components:      Result Value   Acetaminophen (Tylenol), Serum <10 (*)    All other components within normal limits  COMPREHENSIVE METABOLIC PANEL  ETHANOL  SALICYLATE LEVEL  CBC  RAPID URINE DRUG SCREEN, HOSP PERFORMED    EKG None  Radiology No results found.  Procedures Procedures (including critical care time)  Medications Ordered in ED Medications - No data to display   Initial Impression / Assessment and Plan / ED Course  I have reviewed the triage vital signs and the nursing notes.  Pertinent labs & imaging results that were available during my care of the patient were reviewed by me and considered in my medical decision making (see chart for details).        BP 117/78 (BP Location: Right Arm)    Pulse 95    Temp 98.1 F (36.7 C) (Oral)    Resp 18    SpO2 97%    Final Clinical Impressions(s) / ED Diagnoses   Final diagnoses:  Excessive anger    ED Discharge Orders         Ordered    hydrOXYzine (ATARAX/VISTARIL) 25 MG tablet  Every 8 hours PRN     04/24/19 1646         3:26 PM Patient here requesting to be seen by a therapist for his anger management which has been an ongoing issue throughout his life.  He is here at the recommendation of his PCP.  He does not endorse any SI or HI.  4:47 PM DTS has evaluate patient.  Patient was given outpatient resources while follow-up with therapist for the management of his psychiatric illness.  He is otherwise medically cleared..  Will prescribe Vistaril as needed for anxiety.  Return precaution discussed.   Domenic Moras, PA-C 04/24/19 1648    Gareth Morgan, MD 04/25/19 [phone]

## 2019-04-24 NOTE — Discharge Instructions (Signed)
Please use resources provided for outpatient follow up.  Take vistaril as needed for anxiety.

## 2019-04-24 NOTE — ED Triage Notes (Signed)
hre for his anxiety denies SI or HI  states was given meds but they are helping, needs to see a counselor

## 2019-04-29 ENCOUNTER — Telehealth: Payer: Self-pay | Admitting: Family Medicine

## 2019-04-29 NOTE — Telephone Encounter (Signed)
Has he followed up with psychiatry? This is important and needs to be his next step.

## 2019-04-29 NOTE — Telephone Encounter (Signed)
Aware of provider's advice. 

## 2019-04-29 NOTE — Telephone Encounter (Signed)
Please advise on medication request

## 2021-04-19 ENCOUNTER — Other Ambulatory Visit: Payer: Self-pay

## 2021-04-19 ENCOUNTER — Encounter: Payer: Self-pay | Admitting: Family Medicine

## 2021-04-19 ENCOUNTER — Ambulatory Visit: Payer: Medicaid Other | Admitting: Family Medicine

## 2021-04-19 VITALS — BP 107/78 | HR 90 | Temp 98.0°F | Ht 75.0 in | Wt 305.0 lb

## 2021-04-19 DIAGNOSIS — Z23 Encounter for immunization: Secondary | ICD-10-CM

## 2021-04-19 DIAGNOSIS — Z6838 Body mass index (BMI) 38.0-38.9, adult: Secondary | ICD-10-CM | POA: Diagnosis not present

## 2021-04-19 DIAGNOSIS — M5442 Lumbago with sciatica, left side: Secondary | ICD-10-CM

## 2021-04-19 DIAGNOSIS — G8929 Other chronic pain: Secondary | ICD-10-CM

## 2021-04-19 DIAGNOSIS — M2550 Pain in unspecified joint: Secondary | ICD-10-CM

## 2021-04-19 MED ORDER — NAPROXEN 500 MG PO TABS: 500.0000 mg | ORAL_TABLET | Freq: Two times a day (BID) | ORAL | 0 refills | Status: AC

## 2021-04-19 NOTE — Progress Notes (Signed)
Subjective:  Patient ID: Alex Callahan, male    DOB: [birth year], 30 y.o.   MRN: [id number]  Patient Care Team: Baruch Gouty, FNP as PCP - General (Family Medicine)   Chief Complaint:  Knee Pain (Low back pain with numbness and tingling left leg down to ankle//Bilateral knee pain)   HPI: Alex Callahan is a 30 y.o. male presenting on 04/19/2021 for Knee Pain (Low back pain with numbness and tingling left leg down to ankle//Bilateral knee pain)   Pt presents today for evaluation of pain in left shoulder, left lower back, and bilateral knees. States this has been ongoing for years but has become worse over the last few months. He has been using CBD oil with some relief of symptoms. No injuries reported. He does lift his dogs on a regular basis. Left shoulder pain is worse when lifting dogs and when raising arm over head. Knees hurt more when going up and down stairs and with extended activity. Lower back hurts with lifting and bending over. Does report pain and tingling down left anterior leg. No loss of bowel or bladder function, saddle anesthesia, loss of function, numbness, or difficulty walking.     Relevant past medical, surgical, family, and social history reviewed and updated as indicated.  Allergies and medications reviewed and updated. Data reviewed: Chart in Epic.   Past Medical History:  Diagnosis Date   Anxiety     Past Surgical History:  Procedure Laterality Date   WISDOM TOOTH EXTRACTION      Social History   Socioeconomic History   Marital status: Single    Spouse name: Not on file   Number of children: Not on file   Years of education: Not on file   Highest education level: Not on file  Occupational History   Not on file  Tobacco Use   Smoking status: Some Days    Packs/day: 0.25    Types: Cigarettes    Start date: 05/12/2001   Smokeless tobacco: Former    Types: Nurse, children's Use: Every day   Start date: 05/09/2018   Substances:  Nicotine, Flavoring  Substance and Sexual Activity   Alcohol use: Not Currently    Comment: rare   Drug use: No   Sexual activity: Not on file  Other Topics Concern   Not on file  Social History Narrative   Not on file   Social Determinants of Health   Financial Resource Strain: Not on file  Food Insecurity: Not on file  Transportation Needs: Not on file  Physical Activity: Not on file  Stress: Not on file  Social Connections: Not on file  Intimate Partner Violence: Not on file    Outpatient Encounter Medications as of 04/19/2021  Medication Sig   naproxen (NAPROSYN) 500 MG tablet Take 1 tablet (500 mg total) by mouth 2 (two) times daily with a meal for 14 days.   [DISCONTINUED] FLUoxetine (PROZAC) 40 MG capsule Take 1 capsule (40 mg total) by mouth daily.   [DISCONTINUED] hydrOXYzine (ATARAX/VISTARIL) 25 MG tablet Take 1 tablet (25 mg total) by mouth every 8 (eight) hours as needed for anxiety.   No facility-administered encounter medications on file as of 04/19/2021.    No Known Allergies  Review of Systems  Constitutional:  Negative for activity change, appetite change, chills, diaphoresis, fatigue, fever and unexpected weight change.  HENT: Negative.    Eyes: Negative.   Respiratory:  Negative for cough, chest tightness and  shortness of breath.   Cardiovascular:  Negative for chest pain, palpitations and leg swelling.  Gastrointestinal:  Negative for abdominal pain, blood in stool, constipation, diarrhea, nausea and vomiting.  Endocrine: Negative.   Genitourinary:  Negative for decreased urine volume, difficulty urinating, dysuria, frequency and urgency.  Musculoskeletal:  Positive for arthralgias and back pain. Negative for gait problem, joint swelling, myalgias, neck pain and neck stiffness.  Skin: Negative.   Allergic/Immunologic: Negative.   Neurological:  Negative for dizziness, tremors, seizures, syncope, facial asymmetry, speech difficulty, weakness and  light-headedness.  Hematological: Negative.   Psychiatric/Behavioral:  Negative for confusion, hallucinations, sleep disturbance and suicidal ideas.   All other systems reviewed and are negative.      Objective:  BP 107/78   Pulse 90   Temp 98 F (36.7 C)   Ht $R'6\' 3"'zm$  (1.905 m)   Wt (!) 305 lb (138.3 kg)   SpO2 96%   BMI 38.12 kg/m    Wt Readings from Last 3 Encounters:  04/19/21 (!) 305 lb (138.3 kg)  06/23/18 295 lb (133.8 kg)  05/13/18 295 lb (133.8 kg)    Physical Exam Vitals and nursing note reviewed.  Constitutional:      General: He is not in acute distress.    Appearance: Normal appearance. He is well-developed and well-groomed. He is obese. He is not ill-appearing, toxic-appearing or diaphoretic.  HENT:     Head: Normocephalic and atraumatic.     Jaw: There is normal jaw occlusion.     Right Ear: Hearing normal.     Left Ear: Hearing normal.     Nose: Nose normal.     Mouth/Throat:     Lips: Pink.     Mouth: Mucous membranes are moist.     Pharynx: Oropharynx is clear. Uvula midline.  Eyes:     General: Lids are normal.     Extraocular Movements: Extraocular movements intact.     Conjunctiva/sclera: Conjunctivae normal.     Pupils: Pupils are equal, round, and reactive to light.  Neck:     Thyroid: No thyroid mass, thyromegaly or thyroid tenderness.     Vascular: No carotid bruit or JVD.     Trachea: Trachea and phonation normal.  Cardiovascular:     Rate and Rhythm: Normal rate and regular rhythm.     Chest Wall: PMI is not displaced.     Pulses: Normal pulses.     Heart sounds: Normal heart sounds. No murmur heard.   No friction rub. No gallop.  Pulmonary:     Effort: Pulmonary effort is normal. No respiratory distress.     Breath sounds: Normal breath sounds. No wheezing.  Abdominal:     General: Bowel sounds are normal. There is no distension or abdominal bruit.     Palpations: Abdomen is soft. There is no hepatomegaly or splenomegaly.      Tenderness: There is no abdominal tenderness. There is no right CVA tenderness or left CVA tenderness.     Hernia: No hernia is present.  Musculoskeletal:     Right shoulder: Normal.     Left shoulder: Tenderness (anterior shoulder) present. No swelling, deformity, effusion, laceration, bony tenderness or crepitus. Decreased range of motion (pain with adduction). Normal strength. Normal pulse.     Right upper arm: Normal.     Left upper arm: Normal.     Cervical back: Normal, normal range of motion and neck supple.     Thoracic back: Normal.     Lumbar back:  Tenderness (left lower back) present. No swelling, edema, deformity, signs of trauma, lacerations, spasms or bony tenderness. Normal range of motion. Positive left straight leg raise test. Negative right straight leg raise test. No scoliosis.     Right hip: Normal.     Left hip: Normal.     Right upper leg: Normal.     Left upper leg: Normal.     Right knee: No swelling, deformity, effusion, erythema, ecchymosis, lacerations, bony tenderness or crepitus. Normal range of motion. Tenderness present over the patellar tendon. No medial joint line, lateral joint line, MCL, LCL, ACL or PCL tenderness. No LCL laxity, MCL laxity, ACL laxity or PCL laxity. Normal alignment, normal meniscus and normal patellar mobility. Normal pulse.     Instability Tests: Anterior drawer test negative. Posterior drawer test negative. Anterior Lachman test negative. Medial McMurray test negative and lateral McMurray test negative.     Left knee: No swelling, deformity, effusion, erythema, ecchymosis, lacerations, bony tenderness or crepitus. Normal range of motion. Tenderness present over the patellar tendon. No medial joint line, lateral joint line, MCL, LCL, ACL or PCL tenderness. No LCL laxity, MCL laxity, ACL laxity or PCL laxity.Normal alignment, normal meniscus and normal patellar mobility. Normal pulse.     Instability Tests: Anterior drawer test negative.  Posterior drawer test negative. Anterior Lachman test negative. Medial McMurray test negative and lateral McMurray test negative.     Right lower leg: Normal. No edema.     Left lower leg: Normal. No edema.  Lymphadenopathy:     Cervical: No cervical adenopathy.  Skin:    General: Skin is warm and dry.     Capillary Refill: Capillary refill takes less than 2 seconds.     Coloration: Skin is not cyanotic, jaundiced or pale.     Findings: No rash.  Neurological:     General: No focal deficit present.     Mental Status: He is alert and oriented to person, place, and time.     Cranial Nerves: Cranial nerves are intact. No cranial nerve deficit.     Sensory: Sensation is intact. No sensory deficit.     Motor: Motor function is intact. No weakness.     Coordination: Coordination is intact. Coordination normal.     Gait: Gait is intact. Gait normal.     Deep Tendon Reflexes: Reflexes are normal and symmetric. Reflexes normal.  Psychiatric:        Attention and Perception: Attention and perception normal.        Mood and Affect: Mood and affect normal.        Speech: Speech normal.        Behavior: Behavior normal. Behavior is cooperative.        Thought Content: Thought content normal.        Cognition and Memory: Cognition and memory normal.        Judgment: Judgment normal.    Results for orders placed or performed during the hospital encounter of 04/24/19  Comprehensive metabolic panel  Result Value Ref Range   Sodium 135 135 - 145 mmol/L   Potassium 4.0 3.5 - 5.1 mmol/L   Chloride 101 98 - 111 mmol/L   CO2 23 22 - 32 mmol/L   Glucose, Bld 92 70 - 99 mg/dL   BUN 10 6 - 20 mg/dL   Creatinine, Ser 0.98 0.61 - 1.24 mg/dL   Calcium 9.3 8.9 - 10.3 mg/dL   Total Protein 7.5 6.5 - 8.1 g/dL   Albumin  4.5 3.5 - 5.0 g/dL   AST 28 15 - 41 U/L   ALT 28 0 - 44 U/L   Alkaline Phosphatase 66 38 - 126 U/L   Total Bilirubin 0.8 0.3 - 1.2 mg/dL   GFR calc non Af Amer >60 >60 mL/min   GFR  calc Af Amer >60 >60 mL/min   Anion gap 11 5 - 15  Ethanol  Result Value Ref Range   Alcohol, Ethyl (B) <54 <09 mg/dL  Salicylate level  Result Value Ref Range   Salicylate Lvl <8.1 2.8 - 30.0 mg/dL  Acetaminophen level  Result Value Ref Range   Acetaminophen (Tylenol), Serum <10 (L) 10 - 30 ug/mL  cbc  Result Value Ref Range   WBC 10.5 4.0 - 10.5 K/uL   RBC 5.25 4.22 - 5.81 MIL/uL   Hemoglobin 15.5 13.0 - 17.0 g/dL   HCT 46.1 39.0 - 52.0 %   MCV 87.8 80.0 - 100.0 fL   MCH 29.5 26.0 - 34.0 pg   MCHC 33.6 30.0 - 36.0 g/dL   RDW 13.1 11.5 - 15.5 %   Platelets 337 150 - 400 K/uL   nRBC 0.0 0.0 - 0.2 %  Rapid urine drug screen (hospital performed)  Result Value Ref Range   Opiates NONE DETECTED NONE DETECTED   Cocaine NONE DETECTED NONE DETECTED   Benzodiazepines NONE DETECTED NONE DETECTED   Amphetamines NONE DETECTED NONE DETECTED   Tetrahydrocannabinol NONE DETECTED NONE DETECTED   Barbiturates NONE DETECTED NONE DETECTED       Pertinent labs & imaging results that were available during my care of the patient were reviewed by me and considered in my medical decision making.  Assessment & Plan:  Deddrick was seen today for knee pain.  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 Will check ANA for possible underlying autoimmune cause. Will also check CBC, CMP, thyroid function, and Vit D. Symptomatic care discussed in detail. No red flags concerning for cauda equina syndrome. Naproxen as prescribed. Declined PT referral today. Report any new, worsening, or persistent symptoms.  -     ANA,IFA RA Diag Pnl w/rflx Tit/Patn -     naproxen (NAPROSYN) 500 MG tablet; Take 1 tablet (500 mg total) by mouth 2 (two) times daily with a meal for 14 days. -     CMP14+EGFR -     CBC with Differential/Platelet -     Thyroid Panel With TSH -     VITAMIN D 25 Hydroxy (Vit-D Deficiency,  Fractures)  BMI 38.0-38.9,adult Diet and exercise encouraged. Will check below labs. BMI repeat in 6 weeks.  -     CMP14+EGFR -     CBC with Differential/Platelet -     Thyroid Panel With TSH -     Lipid panel  Influenza vaccination given today.    Continue all other maintenance medications.  Follow up plan: Return in about 6 weeks (around 05/31/2021), or if symptoms worsen or fail to improve.   Continue healthy lifestyle choices, including diet (rich in fruits, vegetables, and lean proteins, and low in salt and simple carbohydrates) and exercise (at least 30 minutes of moderate physical activity daily).  Educational handout given for arthritis  The above assessment and management plan was discussed with the patient. The patient verbalized understanding of and has agreed to the management plan. Patient is aware to call the clinic if they develop any new symptoms  or if symptoms persist or worsen. Patient is aware when to return to the clinic for a follow-up visit. Patient educated on when it is appropriate to go to the emergency department.   Monia Pouch, FNP-C [facility] [phone]

## 2021-04-24 LAB — CMP14+EGFR
ALT: 22 IU/L (ref 0–44)
AST: 23 IU/L (ref 0–40)
Albumin/Globulin Ratio: 2 (ref 1.2–2.2)
Albumin: 4.9 g/dL (ref 4.1–5.2)
Alkaline Phosphatase: 76 IU/L (ref 44–121)
BUN/Creatinine Ratio: 13 (ref 9–20)
BUN: 11 mg/dL (ref 6–20)
Bilirubin Total: 0.9 mg/dL (ref 0.0–1.2)
CO2: 25 mmol/L (ref 20–29)
Calcium: 9.9 mg/dL (ref 8.7–10.2)
Chloride: 100 mmol/L (ref 96–106)
Creatinine, Ser: 0.87 mg/dL (ref 0.76–1.27)
Globulin, Total: 2.5 g/dL (ref 1.5–4.5)
Glucose: 80 mg/dL (ref 70–99)
Potassium: 4.7 mmol/L (ref 3.5–5.2)
Sodium: 140 mmol/L (ref 134–144)
Total Protein: 7.4 g/dL (ref 6.0–8.5)
eGFR: 120 mL/min/{1.73_m2} (ref >59)

## 2021-04-24 LAB — CBC WITH DIFFERENTIAL/PLATELET
Basophils Absolute: 0 10*3/uL (ref 0.0–0.2)
Basos: 0 %
EOS (ABSOLUTE): 0.4 10*3/uL (ref 0.0–0.4)
Eos: 4 %
Hematocrit: 47.4 % (ref 37.5–51.0)
Hemoglobin: 16.1 g/dL (ref 13.0–17.7)
Immature Grans (Abs): 0 10*3/uL (ref 0.0–0.1)
Immature Granulocytes: 0 %
Lymphocytes Absolute: 2.9 10*3/uL (ref 0.7–3.1)
Lymphs: 28 %
MCH: 29.3 pg (ref 26.6–33.0)
MCHC: 34 g/dL (ref 31.5–35.7)
MCV: 86 fL (ref 79–97)
Monocytes Absolute: 1.1 10*3/uL — ABNORMAL HIGH (ref 0.1–0.9)
Monocytes: 11 %
Neutrophils Absolute: 5.9 10*3/uL (ref 1.4–7.0)
Neutrophils: 57 %
Platelets: 320 10*3/uL (ref 150–450)
RBC: 5.49 x10E6/uL (ref 4.14–5.80)
RDW: 13.5 % (ref 11.6–15.4)
WBC: 10.4 10*3/uL (ref 3.4–10.8)

## 2021-04-24 LAB — THYROID PANEL WITH TSH
Free Thyroxine Index: 2.4 (ref 1.2–4.9)
T3 Uptake Ratio: 27 % (ref 24–39)
T4, Total: 9 ug/dL (ref 4.5–12.0)
TSH: 2.24 u[IU]/mL (ref 0.450–4.500)

## 2021-04-24 LAB — LIPID PANEL
Chol/HDL Ratio: 5.1 ratio — ABNORMAL HIGH (ref 0.0–5.0)
Cholesterol, Total: 183 mg/dL (ref 100–199)
HDL: 36 mg/dL — ABNORMAL LOW (ref >39)
LDL Chol Calc (NIH): 113 mg/dL — ABNORMAL HIGH (ref 0–99)
Triglycerides: 191 mg/dL — ABNORMAL HIGH (ref 0–149)
VLDL Cholesterol Cal: 34 mg/dL (ref 5–40)

## 2021-04-24 LAB — ANA,IFA RA DIAG PNL W/RFLX TIT/PATN
ANA Titer 1: NEGATIVE
Cyclic Citrullin Peptide Ab: 4 units (ref 0–19)
Rhuematoid fact SerPl-aCnc: 11.4 IU/mL (ref <14.0)

## 2021-04-24 LAB — VITAMIN D 25 HYDROXY (VIT D DEFICIENCY, FRACTURES): Vit D, 25-Hydroxy: 25.3 ng/mL — ABNORMAL LOW (ref 30.0–100.0)

## 2021-07-26 ENCOUNTER — Encounter: Payer: Self-pay | Admitting: Family Medicine

## 2021-07-26 ENCOUNTER — Ambulatory Visit (INDEPENDENT_AMBULATORY_CARE_PROVIDER_SITE_OTHER): Payer: Medicaid Other | Admitting: Family Medicine

## 2021-07-26 VITALS — BP 115/72 | HR 79 | Temp 98.8°F | Ht 75.0 in | Wt 321.0 lb

## 2021-07-26 DIAGNOSIS — R454 Irritability and anger: Secondary | ICD-10-CM | POA: Diagnosis not present

## 2021-07-26 DIAGNOSIS — F320 Major depressive disorder, single episode, mild: Secondary | ICD-10-CM

## 2021-07-26 DIAGNOSIS — M2550 Pain in unspecified joint: Secondary | ICD-10-CM | POA: Diagnosis not present

## 2021-07-26 DIAGNOSIS — F411 Generalized anxiety disorder: Secondary | ICD-10-CM | POA: Diagnosis not present

## 2021-07-26 MED ORDER — MELOXICAM 7.5 MG PO TABS: 7.5000 mg | ORAL_TABLET | Freq: Every day | ORAL | 2 refills | Status: DC

## 2021-07-26 NOTE — Progress Notes (Signed)
Subjective:  Patient ID: Alex Callahan, male    DOB: [birth year], 31 y.o.   MRN: [id number]  Patient Care Team: Baruch Gouty, FNP as PCP - General (Family Medicine)   Chief Complaint:  polyarthralgia   HPI: Alex Callahan is a 31 y.o. male presenting on 07/26/2021 for polyarthralgia   Patient presents today for an evaluation of ongoing polyarthralgia.  Prior work-up unremarkable.  Patient was placed on NSAIDs with relief of symptoms, but states he ran out of medications.  He denies new or worsening symptoms, no reported injuries.  No fever, chills, weakness, malaise, or confusion.  No joint swelling. Patient was treated in the past for anger issues, depression, and anxiety but took self off of medications.  States over the last few months he has noticed an increase in symptoms again, so he started taking his aunts Adderall with improvement in symptoms.  Patient states he would like to be evaluated for ADHD and bipolar disorder so he can get Adderall for treatment.  Patient has never had formal evaluation by psychiatry.  Depression screen [facility] 2/9 07/26/2021 04/19/2021 04/24/2019 06/23/2018 05/13/2018  Decreased Interest 0 1 2 0 0  Down, Depressed, Hopeless 2 3 1 2 1   PHQ - 2 Score 2 4 3 2 1   Altered sleeping 3 0 1 0 0  Tired, decreased energy 3 3 1 1 3   Change in appetite 0 0 0 0 3  Feeling bad or failure about yourself  2 1 0 0 3  Trouble concentrating 0 2 2 0 2  Moving slowly or fidgety/restless 0 1 0 0 2  Suicidal thoughts 0 0 0 0 0  PHQ-9 Score 10 11 7 3 14   Difficult doing work/chores Somewhat difficult Not difficult at all - - -   GAD 7 : Generalized Anxiety Score 07/26/2021 04/19/2021 04/24/2019 06/23/2018  Nervous, Anxious, on Edge 0 0 3 2  Control/stop worrying 1 0 3 0  Worry too much - different things 1 0 3 1  Trouble relaxing 3 0 3 1  Restless 2 0 1 0  Easily annoyed or irritable 0 0 3 2  Afraid - awful might happen 0 0 3 0  Total GAD 7 Score 7 0 19 6  Anxiety  Difficulty Somewhat difficult - - Somewhat difficult       Relevant past medical, surgical, family, and social history reviewed and updated as indicated.  Allergies and medications reviewed and updated. Data reviewed: Chart in Epic.   Past Medical History:  Diagnosis Date   Anxiety     Past Surgical History:  Procedure Laterality Date   WISDOM TOOTH EXTRACTION      Social History   Socioeconomic History   Marital status: Single    Spouse name: Not on file   Number of children: Not on file   Years of education: Not on file   Highest education level: Not on file  Occupational History   Not on file  Tobacco Use   Smoking status: Some Days    Packs/day: 0.25    Types: Cigarettes    Start date: 05/12/2001   Smokeless tobacco: Former    Types: Nurse, children's Use: Every day   Start date: 05/09/2018   Substances: Nicotine, Flavoring  Substance and Sexual Activity   Alcohol use: Not Currently    Comment: rare   Drug use: No   Sexual activity: Not on file  Other Topics Concern   Not  on file  Social History Narrative   Not on file   Social Determinants of Health   Financial Resource Strain: Not on file  Food Insecurity: Not on file  Transportation Needs: Not on file  Physical Activity: Not on file  Stress: Not on file  Social Connections: Not on file  Intimate Partner Violence: Not on file    Outpatient Encounter Medications as of 07/26/2021  Medication Sig   amoxicillin-clavulanate (AUGMENTIN) 875-125 MG tablet Take 1 tablet by mouth 2 (two) times daily.   meloxicam (MOBIC) 7.5 MG tablet Take 1 tablet (7.5 mg total) by mouth daily.   predniSONE (STERAPRED UNI-PAK 21 TAB) 10 MG (21) TBPK tablet TAKE 6 TABLETS ON DAY 1 AS DIRECTED ON PACKAGE AND DECREASE BY 1 TAB EACH DAY FOR A TOTAL OF 6 DAYS   VENTOLIN HFA 108 (90 Base) MCG/ACT inhaler Inhale 2 puffs into the lungs every 4 (four) hours as needed.   No facility-administered encounter medications on  file as of 07/26/2021.    No Known Allergies  Review of Systems  Constitutional:  Positive for activity change and fatigue. Negative for appetite change, chills, diaphoresis, fever and unexpected weight change.  Respiratory:  Negative for cough and shortness of breath.   Cardiovascular:  Negative for chest pain, palpitations and leg swelling.  Genitourinary:  Negative for decreased urine volume and difficulty urinating.  Musculoskeletal:  Positive for arthralgias and myalgias. Negative for back pain, gait problem, joint swelling, neck pain and neck stiffness.  Psychiatric/Behavioral:  Positive for agitation, behavioral problems, decreased concentration and sleep disturbance. Negative for confusion, dysphoric mood, hallucinations, self-injury and suicidal ideas. The patient is nervous/anxious and is hyperactive.        Objective:  BP 115/72    Pulse 79    Temp 98.8 F (37.1 C)    Ht 6' 3"  (1.905 m)    Wt (!) 321 lb (145.6 kg)    SpO2 94%    BMI 40.12 kg/m    Wt Readings from Last 3 Encounters:  07/26/21 (!) 321 lb (145.6 kg)  04/19/21 (!) 305 lb (138.3 kg)  06/23/18 295 lb (133.8 kg)    Physical Exam Vitals and nursing note reviewed.  Constitutional:      General: He is not in acute distress.    Appearance: Normal appearance. He is well-developed. He is obese. He is not ill-appearing, toxic-appearing or diaphoretic.  HENT:     Head: Normocephalic and atraumatic.     Jaw: There is normal jaw occlusion.     Right Ear: Hearing normal.     Left Ear: Hearing normal.     Nose: Nose normal.     Mouth/Throat:     Lips: Pink.     Mouth: Mucous membranes are moist.     Pharynx: Oropharynx is clear. Uvula midline.  Eyes:     General: Lids are normal.     Extraocular Movements: Extraocular movements intact.     Conjunctiva/sclera: Conjunctivae normal.     Pupils: Pupils are equal, round, and reactive to light.  Neck:     Thyroid: No thyroid mass, thyromegaly or thyroid tenderness.      Vascular: No carotid bruit or JVD.     Trachea: Trachea and phonation normal.  Cardiovascular:     Rate and Rhythm: Normal rate and regular rhythm.     Chest Wall: PMI is not displaced.     Pulses: Normal pulses.     Heart sounds: Normal heart sounds. No murmur heard.  No friction rub. No gallop.  Pulmonary:     Effort: Pulmonary effort is normal. No respiratory distress.     Breath sounds: Normal breath sounds. No wheezing.  Abdominal:     General: Bowel sounds are normal. There is no distension or abdominal bruit.     Palpations: Abdomen is soft. There is no hepatomegaly or splenomegaly.     Tenderness: There is no abdominal tenderness. There is no right CVA tenderness or left CVA tenderness.     Hernia: No hernia is present.  Musculoskeletal:        General: Normal range of motion.     Cervical back: Normal range of motion and neck supple.     Right lower leg: No edema.     Left lower leg: No edema.  Lymphadenopathy:     Cervical: No cervical adenopathy.  Skin:    General: Skin is warm and dry.     Capillary Refill: Capillary refill takes less than 2 seconds.     Coloration: Skin is not cyanotic, jaundiced or pale.     Findings: No rash.  Neurological:     General: No focal deficit present.     Mental Status: He is alert and oriented to person, place, and time.     Sensory: Sensation is intact.     Motor: Motor function is intact.     Coordination: Coordination is intact.     Gait: Gait is intact.     Deep Tendon Reflexes: Reflexes are normal and symmetric.  Psychiatric:        Attention and Perception: Attention and perception normal.        Mood and Affect: Mood and affect normal.        Speech: Speech normal.        Behavior: Behavior normal. Behavior is cooperative.        Thought Content: Thought content normal.        Cognition and Memory: Cognition and memory normal.        Judgment: Judgment normal.    Results for orders placed or performed in visit on  04/19/21  ANA,IFA RA Diag Pnl w/rflx Tit/Patn  Result Value Ref Range   ANA Titer 1 Negative    Rhuematoid fact SerPl-aCnc 11.4 <51.7 IU/mL   Cyclic Citrullin Peptide Ab 4 0 - 19 units  CMP14+EGFR  Result Value Ref Range   Glucose 80 70 - 99 mg/dL   BUN 11 6 - 20 mg/dL   Creatinine, Ser 0.87 0.76 - 1.27 mg/dL   eGFR 120 >59 mL/min/1.73   BUN/Creatinine Ratio 13 9 - 20   Sodium 140 134 - 144 mmol/L   Potassium 4.7 3.5 - 5.2 mmol/L   Chloride 100 96 - 106 mmol/L   CO2 25 20 - 29 mmol/L   Calcium 9.9 8.7 - 10.2 mg/dL   Total Protein 7.4 6.0 - 8.5 g/dL   Albumin 4.9 4.1 - 5.2 g/dL   Globulin, Total 2.5 1.5 - 4.5 g/dL   Albumin/Globulin Ratio 2.0 1.2 - 2.2   Bilirubin Total 0.9 0.0 - 1.2 mg/dL   Alkaline Phosphatase 76 44 - 121 IU/L   AST 23 0 - 40 IU/L   ALT 22 0 - 44 IU/L  CBC with Differential/Platelet  Result Value Ref Range   WBC 10.4 3.4 - 10.8 x10E3/uL   RBC 5.49 4.14 - 5.80 x10E6/uL   Hemoglobin 16.1 13.0 - 17.7 g/dL   Hematocrit 47.4 37.5 - 51.0 %   MCV  86 79 - 97 fL   MCH 29.3 26.6 - 33.0 pg   MCHC 34.0 31.5 - 35.7 g/dL   RDW 13.5 11.6 - 15.4 %   Platelets 320 150 - 450 x10E3/uL   Neutrophils 57 Not Estab. %   Lymphs 28 Not Estab. %   Monocytes 11 Not Estab. %   Eos 4 Not Estab. %   Basos 0 Not Estab. %   Neutrophils Absolute 5.9 1.4 - 7.0 x10E3/uL   Lymphocytes Absolute 2.9 0.7 - 3.1 x10E3/uL   Monocytes Absolute 1.1 (H) 0.1 - 0.9 x10E3/uL   EOS (ABSOLUTE) 0.4 0.0 - 0.4 x10E3/uL   Basophils Absolute 0.0 0.0 - 0.2 x10E3/uL   Immature Granulocytes 0 Not Estab. %   Immature Grans (Abs) 0.0 0.0 - 0.1 x10E3/uL  Thyroid Panel With TSH  Result Value Ref Range   TSH 2.240 0.450 - 4.500 uIU/mL   T4, Total 9.0 4.5 - 12.0 ug/dL   T3 Uptake Ratio 27 24 - 39 %   Free Thyroxine Index 2.4 1.2 - 4.9  VITAMIN D 25 Hydroxy (Vit-D Deficiency, Fractures)  Result Value Ref Range   Vit D, 25-Hydroxy 25.3 (L) 30.0 - 100.0 ng/mL  Lipid panel  Result Value Ref Range    Cholesterol, Total 183 100 - 199 mg/dL   Triglycerides 191 (H) 0 - 149 mg/dL   HDL 36 (L) >39 mg/dL   VLDL Cholesterol Cal 34 5 - 40 mg/dL   LDL Chol Calc (NIH) 113 (H) 0 - 99 mg/dL   Chol/HDL Ratio 5.1 (H) 0.0 - 5.0 ratio       Pertinent labs & imaging results that were available during my care of the patient were reviewed by me and considered in my medical decision making.  Assessment & Plan:  Trino was seen today for polyarthralgia.  Diagnoses and all orders for this visit:  Polyarthralgia Symptoms well controlled with naproxen in the past, will change to once daily meloxicam.  Patient aware to report any new, worsening, or persistent symptoms. -     meloxicam (MOBIC) 7.5 MG tablet; Take 1 tablet (7.5 mg total) by mouth daily.  Current mild episode of major depressive disorder without prior episode ([place]) Generalized anxiety disorder Difficulty controlling anger Patient requesting referral to be evaluated for bipolar and ADHD.  Referral to psychiatry placed. -     Ambulatory referral to Psychiatry     Continue all other maintenance medications.  Follow up plan: Return in about 6 weeks (around 09/06/2021), or if symptoms worsen or fail to improve, for arthralgias.   Continue healthy lifestyle choices, including diet (rich in fruits, vegetables, and lean proteins, and low in salt and simple carbohydrates) and exercise (at least 30 minutes of moderate physical activity daily).   The above assessment and management plan was discussed with the patient. The patient verbalized understanding of and has agreed to the management plan. Patient is aware to call the clinic if they develop any new symptoms or if symptoms persist or worsen. Patient is aware when to return to the clinic for a follow-up visit. Patient educated on when it is appropriate to go to the emergency department.   Monia Pouch, FNP-C [facility] [phone]

## 2021-07-27 ENCOUNTER — Ambulatory Visit: Payer: Medicaid Other | Admitting: Family Medicine

## 2021-09-06 ENCOUNTER — Ambulatory Visit: Payer: Medicaid Other | Admitting: Family Medicine

## 2021-09-06 ENCOUNTER — Encounter: Payer: Self-pay | Admitting: Family Medicine

## 2021-09-06 VITALS — BP 108/65 | HR 77 | Temp 98.3°F | Ht 75.0 in | Wt 318.5 lb

## 2021-09-06 DIAGNOSIS — E559 Vitamin D deficiency, unspecified: Secondary | ICD-10-CM | POA: Diagnosis not present

## 2021-09-06 DIAGNOSIS — Z6838 Body mass index (BMI) 38.0-38.9, adult: Secondary | ICD-10-CM

## 2021-09-06 DIAGNOSIS — Z716 Tobacco abuse counseling: Secondary | ICD-10-CM | POA: Diagnosis not present

## 2021-09-06 DIAGNOSIS — M2550 Pain in unspecified joint: Secondary | ICD-10-CM | POA: Diagnosis not present

## 2021-09-06 MED ORDER — NICOTINE 21-14-7 MG/24HR TD KIT: 1.0000 | PACK | Freq: Every day | TRANSDERMAL | 0 refills | Status: DC

## 2021-09-06 MED ORDER — MELOXICAM 7.5 MG PO TABS: 7.5000 mg | ORAL_TABLET | Freq: Every day | ORAL | 6 refills | Status: DC

## 2021-09-06 NOTE — Progress Notes (Signed)
Subjective:  Patient ID: Alex Callahan, male    DOB: [birth year], 31 y.o.   MRN: [id number]  Patient Care Team: Baruch Gouty, FNP as PCP - General (Family Medicine)   Chief Complaint:  polyarthralgia   HPI: Alex Callahan is a 31 y.o. male presenting on 09/06/2021 for polyarthralgia   Pt presents today for reevaluation of polyarthralgia. ANA was negative. Vit D was low and pt was started on repletion and reports he has been taking as prescribed. He was also started on mobic for his polyarthralgia. States he took this for a month and did not get refilled. States it was slightly beneficial. He has followed up with psych and his medications have been adjusted and he is tolerating well.  He reports he would like something to help him to quit smoking. States he was recently treated for bronchitis and feels his symptoms are worse when he smokes. Long discussion about cessation. Educated on cessation and printed out information provided.    Relevant past medical, surgical, family, and social history reviewed and updated as indicated.  Allergies and medications reviewed and updated. Data reviewed: Chart in Epic.   Past Medical History:  Diagnosis Date   Anxiety     Past Surgical History:  Procedure Laterality Date   WISDOM TOOTH EXTRACTION      Social History   Socioeconomic History   Marital status: Single    Spouse name: Not on file   Number of children: Not on file   Years of education: Not on file   Highest education level: Not on file  Occupational History   Not on file  Tobacco Use   Smoking status: Some Days    Packs/day: 0.25    Types: Cigarettes    Start date: 05/12/2001   Smokeless tobacco: Former    Types: Nurse, children's Use: Every day   Start date: 05/09/2018   Substances: Nicotine, Flavoring  Substance and Sexual Activity   Alcohol use: Not Currently    Comment: rare   Drug use: No   Sexual activity: Not on file  Other Topics Concern   Not  on file  Social History Narrative   Not on file   Social Determinants of Health   Financial Resource Strain: Not on file  Food Insecurity: Not on file  Transportation Needs: Not on file  Physical Activity: Not on file  Stress: Not on file  Social Connections: Not on file  Intimate Partner Violence: Not on file    Outpatient Encounter Medications as of 09/06/2021  Medication Sig   lamoTRIgine (LAMICTAL) 25 MG tablet Take by mouth.   Nicotine 21-14-7 MG/24HR KIT Place 1 kit onto the skin daily.   VENTOLIN HFA 108 (90 Base) MCG/ACT inhaler Inhale 2 puffs into the lungs every 4 (four) hours as needed.   [DISCONTINUED] amoxicillin-clavulanate (AUGMENTIN) 875-125 MG tablet Take 1 tablet by mouth 2 (two) times daily.   [DISCONTINUED] predniSONE (STERAPRED UNI-PAK 21 TAB) 10 MG (21) TBPK tablet TAKE 6 TABLETS ON DAY 1 AS DIRECTED ON PACKAGE AND DECREASE BY 1 TAB EACH DAY FOR A TOTAL OF 6 DAYS   meloxicam (MOBIC) 7.5 MG tablet Take 1 tablet (7.5 mg total) by mouth daily.   [DISCONTINUED] lamoTRIgine (LAMICTAL) 100 MG tablet Take 100 mg by mouth at bedtime. (Patient not taking: Reported on 09/06/2021)   [DISCONTINUED] meloxicam (MOBIC) 7.5 MG tablet Take 1 tablet (7.5 mg total) by mouth daily. (Patient not taking: Reported on  09/06/2021)   No facility-administered encounter medications on file as of 09/06/2021.    No Known Allergies  Review of Systems  Constitutional:  Negative for activity change, appetite change, chills, diaphoresis, fatigue, fever and unexpected weight change.  HENT: Negative.    Eyes: Negative.   Respiratory:  Positive for cough and wheezing. Negative for apnea, choking, chest tightness, shortness of breath and stridor.   Cardiovascular:  Negative for chest pain, palpitations and leg swelling.  Gastrointestinal:  Negative for abdominal pain, blood in stool, constipation, diarrhea, nausea and vomiting.  Endocrine: Negative.   Genitourinary:  Negative for decreased urine  volume, difficulty urinating, dysuria, frequency and urgency.  Musculoskeletal:  Positive for arthralgias. Negative for myalgias.  Skin: Negative.   Allergic/Immunologic: Negative.   Neurological:  Negative for dizziness, tremors, seizures, syncope, facial asymmetry, speech difficulty, weakness, light-headedness, numbness and headaches.  Hematological: Negative.   Psychiatric/Behavioral:  Negative for confusion, hallucinations, sleep disturbance and suicidal ideas.   All other systems reviewed and are negative.      Objective:  BP 108/65    Pulse 77    Temp 98.3 F (36.8 C) (Temporal)    Ht 6' 3"  (1.905 m)    Wt (!) 318 lb 8 oz (144.5 kg)    BMI 39.81 kg/m    Wt Readings from Last 3 Encounters:  09/06/21 (!) 318 lb 8 oz (144.5 kg)  07/26/21 (!) 321 lb (145.6 kg)  04/19/21 (!) 305 lb (138.3 kg)    Physical Exam Vitals and nursing note reviewed.  Constitutional:      General: He is not in acute distress.    Appearance: Normal appearance. He is well-developed. He is obese. He is not ill-appearing, toxic-appearing or diaphoretic.  HENT:     Head: Normocephalic and atraumatic.     Jaw: There is normal jaw occlusion.     Right Ear: Hearing normal.     Left Ear: Hearing normal.     Nose: Nose normal.     Mouth/Throat:     Lips: Pink.     Mouth: Mucous membranes are moist.     Pharynx: Oropharynx is clear. Uvula midline.  Eyes:     General: Lids are normal.     Extraocular Movements: Extraocular movements intact.     Conjunctiva/sclera: Conjunctivae normal.     Pupils: Pupils are equal, round, and reactive to light.  Neck:     Thyroid: No thyroid mass, thyromegaly or thyroid tenderness.     Vascular: No carotid bruit or JVD.     Trachea: Trachea and phonation normal.  Cardiovascular:     Rate and Rhythm: Normal rate and regular rhythm.     Chest Wall: PMI is not displaced.     Pulses: Normal pulses.     Heart sounds: Normal heart sounds. No murmur heard.   No friction rub.  No gallop.  Pulmonary:     Effort: Pulmonary effort is normal. No respiratory distress.     Breath sounds: Normal breath sounds. No wheezing.  Abdominal:     General: Bowel sounds are normal. There is no distension or abdominal bruit.     Palpations: Abdomen is soft. There is no hepatomegaly or splenomegaly.     Tenderness: There is no abdominal tenderness. There is no right CVA tenderness or left CVA tenderness.     Hernia: No hernia is present.  Musculoskeletal:        General: Normal range of motion.     Cervical back: Normal range  of motion and neck supple.     Right lower leg: No edema.     Left lower leg: No edema.  Lymphadenopathy:     Cervical: No cervical adenopathy.  Skin:    General: Skin is warm and dry.     Capillary Refill: Capillary refill takes less than 2 seconds.     Coloration: Skin is not cyanotic, jaundiced or pale.     Findings: No rash.  Neurological:     General: No focal deficit present.     Mental Status: He is alert and oriented to person, place, and time.     Sensory: Sensation is intact.     Motor: Motor function is intact.     Coordination: Coordination is intact.     Gait: Gait is intact.     Deep Tendon Reflexes: Reflexes are normal and symmetric.  Psychiatric:        Attention and Perception: Attention and perception normal.        Mood and Affect: Mood and affect normal.        Speech: Speech normal.        Behavior: Behavior normal. Behavior is cooperative.        Thought Content: Thought content normal.        Cognition and Memory: Cognition and memory normal.        Judgment: Judgment normal.    Results for orders placed or performed in visit on 04/19/21  ANA,IFA RA Diag Pnl w/rflx Tit/Patn  Result Value Ref Range   ANA Titer 1 Negative    Rhuematoid fact SerPl-aCnc 11.4 <16.1 IU/mL   Cyclic Citrullin Peptide Ab 4 0 - 19 units  CMP14+EGFR  Result Value Ref Range   Glucose 80 70 - 99 mg/dL   BUN 11 6 - 20 mg/dL   Creatinine, Ser  0.87 0.76 - 1.27 mg/dL   eGFR 120 >59 mL/min/1.73   BUN/Creatinine Ratio 13 9 - 20   Sodium 140 134 - 144 mmol/L   Potassium 4.7 3.5 - 5.2 mmol/L   Chloride 100 96 - 106 mmol/L   CO2 25 20 - 29 mmol/L   Calcium 9.9 8.7 - 10.2 mg/dL   Total Protein 7.4 6.0 - 8.5 g/dL   Albumin 4.9 4.1 - 5.2 g/dL   Globulin, Total 2.5 1.5 - 4.5 g/dL   Albumin/Globulin Ratio 2.0 1.2 - 2.2   Bilirubin Total 0.9 0.0 - 1.2 mg/dL   Alkaline Phosphatase 76 44 - 121 IU/L   AST 23 0 - 40 IU/L   ALT 22 0 - 44 IU/L  CBC with Differential/Platelet  Result Value Ref Range   WBC 10.4 3.4 - 10.8 x10E3/uL   RBC 5.49 4.14 - 5.80 x10E6/uL   Hemoglobin 16.1 13.0 - 17.7 g/dL   Hematocrit 47.4 37.5 - 51.0 %   MCV 86 79 - 97 fL   MCH 29.3 26.6 - 33.0 pg   MCHC 34.0 31.5 - 35.7 g/dL   RDW 13.5 11.6 - 15.4 %   Platelets 320 150 - 450 x10E3/uL   Neutrophils 57 Not Estab. %   Lymphs 28 Not Estab. %   Monocytes 11 Not Estab. %   Eos 4 Not Estab. %   Basos 0 Not Estab. %   Neutrophils Absolute 5.9 1.4 - 7.0 x10E3/uL   Lymphocytes Absolute 2.9 0.7 - 3.1 x10E3/uL   Monocytes Absolute 1.1 (H) 0.1 - 0.9 x10E3/uL   EOS (ABSOLUTE) 0.4 0.0 - 0.4 x10E3/uL   Basophils Absolute  0.0 0.0 - 0.2 x10E3/uL   Immature Granulocytes 0 Not Estab. %   Immature Grans (Abs) 0.0 0.0 - 0.1 x10E3/uL  Thyroid Panel With TSH  Result Value Ref Range   TSH 2.240 0.450 - 4.500 uIU/mL   T4, Total 9.0 4.5 - 12.0 ug/dL   T3 Uptake Ratio 27 24 - 39 %   Free Thyroxine Index 2.4 1.2 - 4.9  VITAMIN D 25 Hydroxy (Vit-D Deficiency, Fractures)  Result Value Ref Range   Vit D, 25-Hydroxy 25.3 (L) 30.0 - 100.0 ng/mL  Lipid panel  Result Value Ref Range   Cholesterol, Total 183 100 - 199 mg/dL   Triglycerides 191 (H) 0 - 149 mg/dL   HDL 36 (L) >39 mg/dL   VLDL Cholesterol Cal 34 5 - 40 mg/dL   LDL Chol Calc (NIH) 113 (H) 0 - 99 mg/dL   Chol/HDL Ratio 5.1 (H) 0.0 - 5.0 ratio       Pertinent labs & imaging results that were available during my care  of the patient were reviewed by me and considered in my medical decision making.  Assessment & Plan:  Aeson was seen today for polyarthralgia.  Diagnoses and all orders for this visit:  Polyarthralgia Has not been taking Mobic as he run out, will refill today. Will repeat labs today.  -     BMP8+EGFR -     CBC with Differential/Platelet -     VITAMIN D 25 Hydroxy (Vit-D Deficiency, Fractures) -     meloxicam (MOBIC) 7.5 MG tablet; Take 1 tablet (7.5 mg total) by mouth daily.  Vitamin D deficiency Has been taking repletion therapy. Will repeat labs today.  -     BMP8+EGFR -     CBC with Differential/Platelet -     VITAMIN D 25 Hydroxy (Vit-D Deficiency, Fractures)  BMI 38.0-38.9,adult Diet and exercise encouraged. Labs pending.  -     BMP8+EGFR -     CBC with Differential/Platelet -     VITAMIN D 25 Hydroxy (Vit-D Deficiency, Fractures)  Encounter for smoking cessation counseling Smoking cessation discussed in detail. Education provided. Nicotine patches as prescribed. Need to discuss with psych if other medications may be beneficial.  -     Nicotine 21-14-7 MG/24HR KIT; Place 1 kit onto the skin daily.     Continue all other maintenance medications.  Follow up plan: Return in about 3 months (around 12/07/2021), or if symptoms worsen or fail to improve.   Continue healthy lifestyle choices, including diet (rich in fruits, vegetables, and lean proteins, and low in salt and simple carbohydrates) and exercise (at least 30 minutes of moderate physical activity daily).  Educational handout given for steps to quit smoking  The above assessment and management plan was discussed with the patient. The patient verbalized understanding of and has agreed to the management plan. Patient is aware to call the clinic if they develop any new symptoms or if symptoms persist or worsen. Patient is aware when to return to the clinic for a follow-up visit. Patient educated on when it is  appropriate to go to the emergency department.   Monia Pouch, FNP-C [facility] [phone]

## 2021-09-07 LAB — CBC WITH DIFFERENTIAL/PLATELET
Basophils Absolute: 0.1 10*3/uL (ref 0.0–0.2)
Basos: 1 %
EOS (ABSOLUTE): 0.4 10*3/uL (ref 0.0–0.4)
Eos: 5 %
Hematocrit: 46.6 % (ref 37.5–51.0)
Hemoglobin: 16.1 g/dL (ref 13.0–17.7)
Immature Grans (Abs): 0 10*3/uL (ref 0.0–0.1)
Immature Granulocytes: 0 %
Lymphocytes Absolute: 2.7 10*3/uL (ref 0.7–3.1)
Lymphs: 33 %
MCH: 29.4 pg (ref 26.6–33.0)
MCHC: 34.5 g/dL (ref 31.5–35.7)
MCV: 85 fL (ref 79–97)
Monocytes Absolute: 0.8 10*3/uL (ref 0.1–0.9)
Monocytes: 10 %
Neutrophils Absolute: 4.1 10*3/uL (ref 1.4–7.0)
Neutrophils: 51 %
Platelets: 303 10*3/uL (ref 150–450)
RBC: 5.48 x10E6/uL (ref 4.14–5.80)
RDW: 13.6 % (ref 11.6–15.4)
WBC: 8.1 10*3/uL (ref 3.4–10.8)

## 2021-09-07 LAB — BMP8+EGFR
BUN/Creatinine Ratio: 13 (ref 9–20)
BUN: 13 mg/dL (ref 6–20)
CO2: 24 mmol/L (ref 20–29)
Calcium: 9.6 mg/dL (ref 8.7–10.2)
Chloride: 103 mmol/L (ref 96–106)
Creatinine, Ser: 0.97 mg/dL (ref 0.76–1.27)
Glucose: 84 mg/dL (ref 70–99)
Potassium: 5.1 mmol/L (ref 3.5–5.2)
Sodium: 142 mmol/L (ref 134–144)
eGFR: 108 mL/min/{1.73_m2} (ref >59)

## 2021-09-07 LAB — VITAMIN D 25 HYDROXY (VIT D DEFICIENCY, FRACTURES): Vit D, 25-Hydroxy: 22.9 ng/mL — ABNORMAL LOW (ref 30.0–100.0)

## 2021-12-12 ENCOUNTER — Encounter: Payer: Self-pay | Admitting: Family Medicine

## 2021-12-12 ENCOUNTER — Ambulatory Visit: Payer: Medicaid Other | Admitting: Family Medicine

## 2021-12-12 VITALS — BP 111/72 | HR 74 | Temp 98.7°F | Ht 75.0 in | Wt 315.0 lb

## 2021-12-12 DIAGNOSIS — E559 Vitamin D deficiency, unspecified: Secondary | ICD-10-CM | POA: Diagnosis not present

## 2021-12-12 DIAGNOSIS — M2550 Pain in unspecified joint: Secondary | ICD-10-CM | POA: Diagnosis not present

## 2021-12-12 DIAGNOSIS — Z79899 Other long term (current) drug therapy: Secondary | ICD-10-CM

## 2021-12-12 DIAGNOSIS — E782 Mixed hyperlipidemia: Secondary | ICD-10-CM

## 2021-12-12 DIAGNOSIS — Z87891 Personal history of nicotine dependence: Secondary | ICD-10-CM

## 2021-12-12 NOTE — Progress Notes (Signed)
Subjective:  Patient ID: Alex Callahan, male    DOB: 1991-04-13, 31 y.o.   MRN: [id number]  Patient Care Team: Baruch Gouty, FNP as PCP - General (Family Medicine)   Chief Complaint:  Medical Management of Chronic Issues   HPI: Alex Callahan is a 31 y.o. male presenting on 12/12/2021 for Medical Management of Chronic Issues   Pt presents today for polyarthralgia, hyperlipidemia, Vit D deficiency, and nicotine dependence. He has been taking mobic and Vit D as prescribed and was prescribed gabapentin by his psychiatrist. States his arthralgias have resolved. No recent fractures or trouble walking. At last visit he was prescribed nicotine patches for smoking cessation. States he was very successful with this, quit smoking a little over a month ago. Does not need refill on nicotine patches as he is doing very well. He has not changed his diet and is not exercising on a regular basis. Last lipid panel abnormal. He is on lamictal from psychiatry and has not had a lamictal level drawn recently.    Relevant past medical, surgical, family, and social history reviewed and updated as indicated.  Allergies and medications reviewed and updated. Data reviewed: Chart in Epic.   Past Medical History:  Diagnosis Date   Anxiety     Past Surgical History:  Procedure Laterality Date   WISDOM TOOTH EXTRACTION      Social History   Socioeconomic History   Marital status: Single    Spouse name: Not on file   Number of children: Not on file   Years of education: Not on file   Highest education level: Not on file  Occupational History   Not on file  Tobacco Use   Smoking status: Former    Packs/day: 0.25    Types: Cigarettes    Start date: 05/12/2001    Quit date: 11/06/2021    Years since quitting: 0.0   Smokeless tobacco: Former    Types: Chew  Vaping Use   Vaping Use: Former   Start date: 05/09/2018   Quit date: 11/06/2021   Substances: Nicotine, Flavoring  Substance and Sexual  Activity   Alcohol use: Not Currently    Comment: rare   Drug use: No   Sexual activity: Not on file  Other Topics Concern   Not on file  Social History Narrative   Not on file   Social Determinants of Health   Financial Resource Strain: Not on file  Food Insecurity: Not on file  Transportation Needs: Not on file  Physical Activity: Not on file  Stress: Not on file  Social Connections: Not on file  Intimate Partner Violence: Not on file    Outpatient Encounter Medications as of 12/12/2021  Medication Sig   ADVAIR HFA 45-21 MCG/ACT inhaler Inhale 2 puffs into the lungs 2 (two) times daily.   gabapentin (NEURONTIN) 300 MG capsule Take 300 mg by mouth 3 (three) times daily.   lamoTRIgine (LAMICTAL) 25 MG tablet Take by mouth.   meloxicam (MOBIC) 7.5 MG tablet Take 1 tablet (7.5 mg total) by mouth daily.   traZODone (DESYREL) 50 MG tablet Take 50-100 mg by mouth at bedtime as needed.   VENTOLIN HFA 108 (90 Base) MCG/ACT inhaler Inhale 2 puffs into the lungs every 4 (four) hours as needed.   [DISCONTINUED] Nicotine 21-14-7 MG/24HR KIT Place 1 kit onto the skin daily.   No facility-administered encounter medications on file as of 12/12/2021.    No Known Allergies  Review of Systems  Constitutional:  Negative for activity change, appetite change, chills, diaphoresis, fatigue, fever and unexpected weight change.  HENT: Negative.    Eyes: Negative.  Negative for photophobia and visual disturbance.  Respiratory:  Negative for cough, chest tightness and shortness of breath.   Cardiovascular:  Negative for chest pain, palpitations and leg swelling.  Gastrointestinal:  Negative for abdominal pain, blood in stool, constipation, diarrhea, nausea and vomiting.  Endocrine: Negative.  Negative for polydipsia, polyphagia and polyuria.  Genitourinary:  Negative for decreased urine volume, difficulty urinating, dysuria, frequency and urgency.  Musculoskeletal:  Negative for arthralgias, back  pain, gait problem, joint swelling, myalgias, neck pain and neck stiffness.  Skin: Negative.   Allergic/Immunologic: Negative.   Neurological:  Negative for dizziness, tremors, seizures, syncope, facial asymmetry, speech difficulty, weakness, light-headedness, numbness and headaches.  Hematological: Negative.   Psychiatric/Behavioral:  Negative for confusion, hallucinations, sleep disturbance and suicidal ideas.   All other systems reviewed and are negative.      Objective:  BP 111/72   Pulse 74   Temp 98.7 F (37.1 C)   Ht 6' 3"  (1.905 m)   Wt (!) 315 lb (142.9 kg)   SpO2 98%   BMI 39.37 kg/m    Wt Readings from Last 3 Encounters:  12/12/21 (!) 315 lb (142.9 kg)  09/06/21 (!) 318 lb 8 oz (144.5 kg)  07/26/21 (!) 321 lb (145.6 kg)    Physical Exam Vitals and nursing note reviewed.  Constitutional:      General: He is not in acute distress.    Appearance: Normal appearance. He is well-developed. He is obese. He is not ill-appearing, toxic-appearing or diaphoretic.  HENT:     Head: Normocephalic and atraumatic.     Jaw: There is normal jaw occlusion.     Right Ear: Hearing normal.     Left Ear: Hearing normal.     Nose: Nose normal.     Mouth/Throat:     Lips: Pink.     Mouth: Mucous membranes are moist.     Pharynx: Oropharynx is clear. Uvula midline.  Eyes:     General: Lids are normal.     Extraocular Movements: Extraocular movements intact.     Conjunctiva/sclera: Conjunctivae normal.     Pupils: Pupils are equal, round, and reactive to light.  Neck:     Thyroid: No thyroid mass, thyromegaly or thyroid tenderness.     Vascular: No carotid bruit or JVD.     Trachea: Trachea and phonation normal.  Cardiovascular:     Rate and Rhythm: Normal rate and regular rhythm.     Chest Wall: PMI is not displaced.     Pulses: Normal pulses.     Heart sounds: Normal heart sounds. No murmur heard.   No friction rub. No gallop.  Pulmonary:     Effort: Pulmonary effort is  normal. No respiratory distress.     Breath sounds: Normal breath sounds. No wheezing.  Abdominal:     General: Bowel sounds are normal. There is no distension or abdominal bruit.     Palpations: Abdomen is soft. There is no hepatomegaly or splenomegaly.     Tenderness: There is no abdominal tenderness. There is no right CVA tenderness or left CVA tenderness.     Hernia: No hernia is present.  Musculoskeletal:        General: Normal range of motion.     Cervical back: Normal range of motion and neck supple.     Right lower leg: No  edema.     Left lower leg: No edema.  Lymphadenopathy:     Cervical: No cervical adenopathy.  Skin:    General: Skin is warm and dry.     Capillary Refill: Capillary refill takes less than 2 seconds.     Coloration: Skin is not cyanotic, jaundiced or pale.     Findings: No rash.  Neurological:     General: No focal deficit present.     Mental Status: He is alert and oriented to person, place, and time.     Sensory: Sensation is intact.     Motor: Motor function is intact.     Coordination: Coordination is intact.     Gait: Gait is intact.     Deep Tendon Reflexes: Reflexes are normal and symmetric.  Psychiatric:        Attention and Perception: Attention and perception normal.        Mood and Affect: Mood and affect normal.        Speech: Speech normal.        Behavior: Behavior normal. Behavior is cooperative.        Thought Content: Thought content normal.        Cognition and Memory: Cognition and memory normal.        Judgment: Judgment normal.    Results for orders placed or performed in visit on 09/06/21  [facility]  Result Value Ref Range   Glucose 84 70 - 99 mg/dL   BUN 13 6 - 20 mg/dL   Creatinine, Ser 0.97 0.76 - 1.27 mg/dL   eGFR 108 >59 mL/min/1.73   BUN/Creatinine Ratio 13 9 - 20   Sodium 142 134 - 144 mmol/L   Potassium 5.1 3.5 - 5.2 mmol/L   Chloride 103 96 - 106 mmol/L   CO2 24 20 - 29 mmol/L   Calcium 9.6 8.7 - 10.2 mg/dL   CBC with Differential/Platelet  Result Value Ref Range   WBC 8.1 3.4 - 10.8 x10E3/uL   RBC 5.48 4.14 - 5.80 x10E6/uL   Hemoglobin 16.1 13.0 - 17.7 g/dL   Hematocrit 46.6 37.5 - 51.0 %   MCV 85 79 - 97 fL   MCH 29.4 26.6 - 33.0 pg   MCHC 34.5 31.5 - 35.7 g/dL   RDW 13.6 11.6 - 15.4 %   Platelets 303 150 - 450 x10E3/uL   Neutrophils 51 Not Estab. %   Lymphs 33 Not Estab. %   Monocytes 10 Not Estab. %   Eos 5 Not Estab. %   Basos 1 Not Estab. %   Neutrophils Absolute 4.1 1.4 - 7.0 x10E3/uL   Lymphocytes Absolute 2.7 0.7 - 3.1 x10E3/uL   Monocytes Absolute 0.8 0.1 - 0.9 x10E3/uL   EOS (ABSOLUTE) 0.4 0.0 - 0.4 x10E3/uL   Basophils Absolute 0.1 0.0 - 0.2 x10E3/uL   Immature Granulocytes 0 Not Estab. %   Immature Grans (Abs) 0.0 0.0 - 0.1 x10E3/uL  VITAMIN D 25 Hydroxy (Vit-D Deficiency, Fractures)  Result Value Ref Range   Vit D, 25-Hydroxy 22.9 (L) 30.0 - 100.0 ng/mL       Pertinent labs & imaging results that were available during my care of the patient were reviewed by me and considered in my medical decision making.  Assessment & Plan:  Alex Callahan was seen today for medical management of chronic issues.  Diagnoses and all orders for this visit:  Polyarthralgia Vitamin D deficiency Doing well on current regimen. Will recheck BMP and Vit D today. Continue current  regimen.  -     BMP8+EGFR -     VITAMIN D 25 Hydroxy (Vit-D Deficiency, Fractures)  High risk medication use On NSAID therapy and lamictal, will check renal function and lamictal leel today.  -     BMP8+EGFR -     Lamotrigine level  Mixed hyperlipidemia Fasting today. Will check labs. Diet and exercise encouraged.  -     Lipid panel  Former smoker Continued cessation encouraged.     Continue all other maintenance medications.  Follow up plan: Return in about 6 months (around 06/13/2022), or if symptoms worsen or fail to improve.   Continue healthy lifestyle choices, including diet (rich in fruits,  vegetables, and lean proteins, and low in salt and simple carbohydrates) and exercise (at least 30 minutes of moderate physical activity daily).  Educational handout given for high cholesterol   The above assessment and management plan was discussed with the patient. The patient verbalized understanding of and has agreed to the management plan. Patient is aware to call the clinic if they develop any new symptoms or if symptoms persist or worsen. Patient is aware when to return to the clinic for a follow-up visit. Patient educated on when it is appropriate to go to the emergency department.   Monia Pouch, FNP-C [facility] [phone]

## 2021-12-13 LAB — BMP8+EGFR
BUN/Creatinine Ratio: 13 (ref 9–20)
BUN: 11 mg/dL (ref 6–20)
CO2: 24 mmol/L (ref 20–29)
Calcium: 9.5 mg/dL (ref 8.7–10.2)
Chloride: 102 mmol/L (ref 96–106)
Creatinine, Ser: 0.86 mg/dL (ref 0.76–1.27)
Glucose: 89 mg/dL (ref 70–99)
Potassium: 4.5 mmol/L (ref 3.5–5.2)
Sodium: 140 mmol/L (ref 134–144)
eGFR: 119 mL/min/{1.73_m2} (ref >59)

## 2021-12-13 LAB — LIPID PANEL
Chol/HDL Ratio: 5.1 ratio — ABNORMAL HIGH (ref 0.0–5.0)
Cholesterol, Total: 162 mg/dL (ref 100–199)
HDL: 32 mg/dL — ABNORMAL LOW (ref >39)
LDL Chol Calc (NIH): 86 mg/dL (ref 0–99)
Triglycerides: 261 mg/dL — ABNORMAL HIGH (ref 0–149)
VLDL Cholesterol Cal: 44 mg/dL — ABNORMAL HIGH (ref 5–40)

## 2021-12-13 LAB — VITAMIN D 25 HYDROXY (VIT D DEFICIENCY, FRACTURES): Vit D, 25-Hydroxy: 22.5 ng/mL — ABNORMAL LOW (ref 30.0–100.0)

## 2021-12-13 LAB — LAMOTRIGINE LEVEL: Lamotrigine Lvl: <1 ug/mL — ABNORMAL LOW (ref 2.0–20.0)

## 2022-06-13 ENCOUNTER — Encounter: Payer: Self-pay | Admitting: Family Medicine

## 2022-06-13 ENCOUNTER — Ambulatory Visit: Payer: Medicaid Other | Admitting: Family Medicine

## 2022-11-26 NOTE — Telephone Encounter (Signed)
Erroneous encounter will close.

## 2023-09-16 ENCOUNTER — Encounter (HOSPITAL_COMMUNITY): Payer: Self-pay | Admitting: Oral Surgery

## 2023-09-16 ENCOUNTER — Other Ambulatory Visit: Payer: Self-pay

## 2023-09-16 NOTE — Progress Notes (Signed)
 SDW CALL  Patient was given pre-op instructions over the phone. The opportunity was given for the patient to ask questions. No further questions asked. Patient verbalized understanding of instructions given.   PCP - Nigel Mormon - in care everywhere Cardiologist - denies  PPM/ICD - denies Device Orders - n/a Rep Notified - n/a  Chest x-ray - 05/03/23 - CE EKG - denies Stress Test - denies ECHO - denies Cardiac Cath - denies  Sleep Study - denies CPAP - n/a  No DM  Last dose of GLP1 agonist-  n/a GLP1 instructions: n/a  Blood Thinner Instructions: n/a Aspirin Instructions: n/a  ERAS Protcol - NPO   COVID TEST- n/a   Anesthesia review: no  Patient denies shortness of breath, fever, cough and chest pain over the phone call   All instructions explained to the patient, with a verbal understanding of the material. Patient agrees to go over the instructions while at home for a better understanding.

## 2023-09-16 NOTE — H&P (Signed)
 Date: August 20, 2023   Patient: Alex Callahan  PID: [id number]  DOB: [birth year]  SEX: Male   Patient referred by DDS for extraction teeth  CC: No pain.  Past Medical History:  Low Blood Pressure, Snoring, Pneumonia, vapes, Smoker, Chew Tobacco, Gallbladder trouble, Mental Health problems, Learning Disabilities, Morbid Obesity    Medications: Albuterol    Allergies:     NKDA    Surgeries:   Oral Surgery, gallbladder removal     Social History       Smoking: Vapes           Alcohol: Drug use:                             Exam: BMI  44. Multiple caries teeth # 3, 6, 7, 8, 9, 10, 11, 12, 13, 14, 15.  No purulence, edema, fluctuance, trismus. Oral cancer screening negative. Pharynx clear. No lymphadenopathy.  Panorex:Multiple caries teeth # 3, 6, 7, 8, 9, 10, 11, 12, 13, 14, 15.  Assessment: ASA . Non-restorable  teeth # 3, 6, 7, 8, 9, 10, 11, 12, 13, 14, 15.               Plan: Extraction Teeth #  3, 6, 7, 8, 9, 10, 11, 12, 13, 14, 15. Alveoloplasty.  Hospital Day surgery.                 Rx: n               Risks and complications explained. Questions answered.   Georgia Lopes, DMD

## 2023-09-16 NOTE — Anesthesia Preprocedure Evaluation (Signed)
 Anesthesia Evaluation  Patient identified by MRN, date of birth, ID band Patient awake    Reviewed: Allergy & Precautions, NPO status , Patient's Chart, lab work & pertinent test results  History of Anesthesia Complications Negative for: history of anesthetic complications  Airway Mallampati: II  TM Distance: >3 FB Neck ROM: Full    Dental  (+) Missing, Poor Dentition   Pulmonary asthma , Patient abstained from smoking., former smoker   Pulmonary exam normal        Cardiovascular negative cardio ROS Normal cardiovascular exam     Neuro/Psych   Anxiety Depression       GI/Hepatic negative GI ROS, Neg liver ROS,,,  Endo/Other    Class 3 obesity  Renal/GU negative Renal ROS  negative genitourinary   Musculoskeletal negative musculoskeletal ROS (+)    Abdominal   Peds  Hematology negative hematology ROS (+)   Anesthesia Other Findings Day of surgery medications reviewed with patient.  Reproductive/Obstetrics negative OB ROS                             Anesthesia Physical Anesthesia Plan  ASA: 3  Anesthesia Plan: General   Post-op Pain Management: Tylenol PO (pre-op)*   Induction: Intravenous  PONV Risk Score and Plan: 2 and Ondansetron, Dexamethasone, Treatment may vary due to age or medical condition and Midazolam  Airway Management Planned: Nasal ETT  Additional Equipment: None  Intra-op Plan:   Post-operative Plan: Extubation in OR  Informed Consent: I have reviewed the patients History and Physical, chart, labs and discussed the procedure including the risks, benefits and alternatives for the proposed anesthesia with the patient or authorized representative who has indicated his/her understanding and acceptance.     Dental advisory given  Plan Discussed with: CRNA  Anesthesia Plan Comments:        Anesthesia Quick Evaluation

## 2023-09-17 ENCOUNTER — Encounter (HOSPITAL_COMMUNITY)
Admission: RE | Disposition: A | Discharge status: Home / Self Care | Payer: Self-pay | Source: Home / Self Care | Attending: Oral Surgery

## 2023-09-17 ENCOUNTER — Ambulatory Visit (HOSPITAL_COMMUNITY)
Admission: RE | Admit: 2023-09-17 | Discharge: 2023-09-17 | Disposition: A | Discharge status: Home / Self Care | Payer: Medicaid Other | Attending: Oral Surgery | Admitting: Oral Surgery

## 2023-09-17 ENCOUNTER — Ambulatory Visit (HOSPITAL_BASED_OUTPATIENT_CLINIC_OR_DEPARTMENT_OTHER): Payer: Self-pay | Admitting: Anesthesiology

## 2023-09-17 ENCOUNTER — Other Ambulatory Visit: Payer: Self-pay

## 2023-09-17 ENCOUNTER — Ambulatory Visit (HOSPITAL_COMMUNITY): Payer: Self-pay | Admitting: Anesthesiology

## 2023-09-17 ENCOUNTER — Encounter (HOSPITAL_COMMUNITY): Payer: Self-pay | Admitting: Oral Surgery

## 2023-09-17 DIAGNOSIS — J45909 Unspecified asthma, uncomplicated: Secondary | ICD-10-CM

## 2023-09-17 DIAGNOSIS — Z87891 Personal history of nicotine dependence: Secondary | ICD-10-CM

## 2023-09-17 DIAGNOSIS — K029 Dental caries, unspecified: Secondary | ICD-10-CM | POA: Insufficient documentation

## 2023-09-17 DIAGNOSIS — Z6841 Body Mass Index (BMI) 40.0 and over, adult: Secondary | ICD-10-CM | POA: Diagnosis not present

## 2023-09-17 DIAGNOSIS — F418 Other specified anxiety disorders: Secondary | ICD-10-CM | POA: Diagnosis not present

## 2023-09-17 HISTORY — DX: Personal history of pneumonia (recurrent): Z87.01

## 2023-09-17 HISTORY — DX: Unspecified asthma, uncomplicated: J45.909

## 2023-09-17 HISTORY — PX: TOOTH EXTRACTION: SHX859

## 2023-09-17 LAB — CBC
HCT: 47.3 % (ref 39.0–52.0)
Hemoglobin: 16.1 g/dL (ref 13.0–17.0)
MCH: 28.6 pg (ref 26.0–34.0)
MCHC: 34 g/dL (ref 30.0–36.0)
MCV: 84 fL (ref 80.0–100.0)
Platelets: 336 10*3/uL (ref 150–400)
RBC: 5.63 MIL/uL (ref 4.22–5.81)
RDW: 12.8 % (ref 11.5–15.5)
WBC: 8.6 10*3/uL (ref 4.0–10.5)
nRBC: 0 % (ref 0.0–0.2)

## 2023-09-17 SURGERY — DENTAL RESTORATION/EXTRACTIONS
Anesthesia: General

## 2023-09-17 MED ORDER — DEXMEDETOMIDINE HCL IN NACL 80 MCG/20ML IV SOLN
INTRAVENOUS | Status: DC | PRN
  Administered 2023-09-17 (×2): 4 ug via INTRAVENOUS

## 2023-09-17 MED ORDER — FENTANYL CITRATE (PF) 250 MCG/5ML IJ SOLN
INTRAMUSCULAR | Status: AC
  Filled 2023-09-17: qty 5

## 2023-09-17 MED ORDER — SUGAMMADEX SODIUM 200 MG/2ML IV SOLN
INTRAVENOUS | Status: DC | PRN
  Administered 2023-09-17: 200 mg via INTRAVENOUS

## 2023-09-17 MED ORDER — CEFAZOLIN SODIUM-DEXTROSE 3-4 GM/150ML-% IV SOLN
3.0000 g | INTRAVENOUS | Status: AC
  Administered 2023-09-17: 2 g via INTRAVENOUS
  Filled 2023-09-17: qty 150

## 2023-09-17 MED ORDER — LIDOCAINE-EPINEPHRINE 2 %-1:100000 IJ SOLN
INTRAMUSCULAR | Status: AC
Start: 2023-09-17 — End: ?
  Filled 2023-09-17: qty 1

## 2023-09-17 MED ORDER — CEFAZOLIN SODIUM-DEXTROSE 2-4 GM/100ML-% IV SOLN
INTRAVENOUS | Status: AC
  Filled 2023-09-17: qty 100

## 2023-09-17 MED ORDER — PROPOFOL 10 MG/ML IV BOLUS
INTRAVENOUS | Status: DC | PRN
  Administered 2023-09-17: 200 mg via INTRAVENOUS

## 2023-09-17 MED ORDER — ROCURONIUM BROMIDE 10 MG/ML (PF) SYRINGE
PREFILLED_SYRINGE | INTRAVENOUS | Status: DC | PRN
Start: 2023-09-17 — End: 2023-09-17
  Administered 2023-09-17: 60 mg via INTRAVENOUS

## 2023-09-17 MED ORDER — ORAL CARE MOUTH RINSE: 15.0000 mL | Freq: Once | OROMUCOSAL | Status: AC

## 2023-09-17 MED ORDER — ACETAMINOPHEN 500 MG PO TABS
1000.0000 mg | ORAL_TABLET | Freq: Once | ORAL | Status: AC
Start: 2023-09-17 — End: 2023-09-17
  Administered 2023-09-17: 1000 mg via ORAL
  Filled 2023-09-17: qty 2

## 2023-09-17 MED ORDER — LIDOCAINE 2% (20 MG/ML) 5 ML SYRINGE
INTRAMUSCULAR | Status: DC | PRN
Start: 2023-09-17 — End: 2023-09-17
  Administered 2023-09-17: 100 mg via INTRAVENOUS

## 2023-09-17 MED ORDER — OXYCODONE-ACETAMINOPHEN 5-325 MG PO TABS: 1.0000 | ORAL_TABLET | ORAL | 0 refills | Status: AC | PRN

## 2023-09-17 MED ORDER — DEXAMETHASONE SODIUM PHOSPHATE 10 MG/ML IJ SOLN
INTRAMUSCULAR | Status: DC | PRN
  Administered 2023-09-17: 10 mg via INTRAVENOUS

## 2023-09-17 MED ORDER — LACTATED RINGERS IV SOLN: INTRAVENOUS | Status: DC

## 2023-09-17 MED ORDER — CHLORHEXIDINE GLUCONATE 0.12 % MT SOLN
15.0000 mL | Freq: Once | OROMUCOSAL | Status: AC
  Administered 2023-09-17: 15 mL via OROMUCOSAL
  Filled 2023-09-17: qty 15

## 2023-09-17 MED ORDER — SODIUM CHLORIDE 0.9 % IR SOLN
Status: DC | PRN
  Administered 2023-09-17: 1000 mL

## 2023-09-17 MED ORDER — ONDANSETRON HCL 4 MG/2ML IJ SOLN
INTRAMUSCULAR | Status: DC | PRN
  Administered 2023-09-17: 4 mg via INTRAVENOUS

## 2023-09-17 MED ORDER — 0.9 % SODIUM CHLORIDE (POUR BTL) OPTIME
TOPICAL | Status: DC | PRN
  Administered 2023-09-17: 1000 mL

## 2023-09-17 MED ORDER — AMOXICILLIN 500 MG PO CAPS: 500.0000 mg | ORAL_CAPSULE | Freq: Three times a day (TID) | ORAL | 0 refills | Status: AC

## 2023-09-17 MED ORDER — LIDOCAINE-EPINEPHRINE 2 %-1:100000 IJ SOLN
INTRAMUSCULAR | Status: DC | PRN
  Administered 2023-09-17: 18 mL

## 2023-09-17 MED ORDER — PROPOFOL 10 MG/ML IV BOLUS
INTRAVENOUS | Status: AC
  Filled 2023-09-17: qty 20

## 2023-09-17 MED ORDER — FENTANYL CITRATE (PF) 250 MCG/5ML IJ SOLN
INTRAMUSCULAR | Status: DC | PRN
Start: 2023-09-17 — End: 2023-09-17
  Administered 2023-09-17 (×3): 50 ug via INTRAVENOUS

## 2023-09-17 MED ORDER — MIDAZOLAM HCL 2 MG/2ML IJ SOLN
INTRAMUSCULAR | Status: DC | PRN
  Administered 2023-09-17: 2 mg via INTRAVENOUS

## 2023-09-17 MED ORDER — OXYMETAZOLINE HCL 0.05 % NA SOLN
NASAL | Status: DC | PRN
  Administered 2023-09-17: 2 via NASAL

## 2023-09-17 MED ORDER — MIDAZOLAM HCL 2 MG/2ML IJ SOLN
INTRAMUSCULAR | Status: AC
Start: 2023-09-17 — End: ?
  Filled 2023-09-17: qty 2

## 2023-09-17 SURGICAL SUPPLY — 27 items
BAG COUNTER SPONGE SURGICOUNT (BAG) IMPLANT
BLADE SURG 15 STRL LF DISP TIS (BLADE) ×1 IMPLANT
BUR CROSS CUT FISSURE 1.6 (BURR) ×1 IMPLANT
BUR EGG ELITE 4.0 (BURR) ×1 IMPLANT
CANISTER SUCT 3000ML PPV (MISCELLANEOUS) ×1 IMPLANT
COVER SURGICAL LIGHT HANDLE (MISCELLANEOUS) ×1 IMPLANT
GAUZE PACKING FOLDED 2 STR (GAUZE/BANDAGES/DRESSINGS) ×1 IMPLANT
GLOVE BIO SURGEON STRL SZ8 (GLOVE) ×1 IMPLANT
GOWN STRL REUS W/ TWL LRG LVL3 (GOWN DISPOSABLE) ×1 IMPLANT
GOWN STRL REUS W/ TWL XL LVL3 (GOWN DISPOSABLE) ×1 IMPLANT
IV NS 1000ML BAXH (IV SOLUTION) ×1 IMPLANT
KIT BASIN OR (CUSTOM PROCEDURE TRAY) ×1 IMPLANT
KIT TURNOVER KIT B (KITS) ×1 IMPLANT
NDL HYPO 25GX1X1/2 BEV (NEEDLE) ×2 IMPLANT
NEEDLE HYPO 25GX1X1/2 BEV (NEEDLE) ×1 IMPLANT
NS IRRIG 1000ML POUR BTL (IV SOLUTION) ×1 IMPLANT
PAD ARMBOARD 7.5X6 YLW CONV (MISCELLANEOUS) ×1 IMPLANT
SLEEVE IRRIGATION ELITE 7 (MISCELLANEOUS) ×1 IMPLANT
SPIKE FLUID TRANSFER (MISCELLANEOUS) ×1 IMPLANT
SPONGE SURGIFOAM ABS GEL 12-7 (HEMOSTASIS) IMPLANT
SUT CHROMIC 3 0 SH 27 (SUTURE) IMPLANT
SUT PLAIN 3 0 PS2 27 (SUTURE) ×1 IMPLANT
SYR BULB IRRIG 60ML STRL (SYRINGE) ×1 IMPLANT
SYR CONTROL 10ML LL (SYRINGE) ×1 IMPLANT
TRAY ENT MC OR (CUSTOM PROCEDURE TRAY) ×1 IMPLANT
TUBING IRRIGATION (MISCELLANEOUS) ×1 IMPLANT
YANKAUER SUCT BULB TIP NO VENT (SUCTIONS) ×1 IMPLANT

## 2023-09-17 NOTE — Op Note (Unsigned)
 Alex Callahan, [name] MEDICAL RECORD NO: [id number] ACCOUNT NO: [id number] DATE OF BIRTH: Nov 11, 1990 FACILITY: [facility] LOCATION: [facility] PHYSICIAN: Georgia Lopes, DDS  Operative Report   DATE OF PROCEDURE: 09/17/2023  PREOPERATIVE DIAGNOSES:  Nonrestorable teeth numbers 3, 5, 6, 7, 8, 9, 10, 11, 12, 13, 14, and 15 secondary to dental caries.  POSTOPERATIVE DIAGNOSES:  Nonrestorable teeth numbers 3, 5, 6, 7, 8, 9, 10, 11, 12, 13, 14, and 15 secondary to dental caries.  PROCEDURE:  Extraction teeth numbers 3, 5, 6, 7, 8, 9, 10, 11, 12, 13, 14, and 15.  Alveoloplasty, right and left maxilla and mandible.  SURGEON:  Georgia Lopes, DDS  ANESTHESIA:  General.  Nasal intubation.  Dr.  Stephannie Peters, attending.  DESCRIPTION OF PROCEDURE:  The patient was taken to the operating room and placed on the table in supine position.  General anesthesia was administered.  A nasal endotracheal tube was placed and secured.  The eyes were protected.  The patient was draped  for surgery.  A timeout was performed.  The posterior pharynx was suctioned and a throat pack was placed.  2% lidocaine, 1:100,000 epinephrine was infiltrated buccally and palatally around the maxillary teeth.  A total of 18 mL of solution was utilized.   A bite block was placed on the right side of the mouth.  A Sweetheart retractor was used to retract the tongue.  A 15 blade was used to make an incision beginning at tooth #15 carried forward in the buccal and palatal sulcus of the maxillary teeth until  tooth #6 was encountered on the other side of the midline.  The periosteal elevator was used to reflect the periosteum.  Teeth numbers 14 and 15 were elevated with a 301 elevator and removed with the dental forceps.  Tooth numbers 12 and 13 were  residual roots and could not be accessed with the forceps or the elevator.  The Stryker handpiece was then used to remove circumferential bone and interproximal bone between these roots so that purchase  point could be obtained and then the teeth could be  elevated and were removed with the rongeurs.  Then, tooth #11 could not be removed with the dental forceps.  The Stryker handpiece was used to remove bone around the crown circumferentially and then the tooth was grasped with the forceps and removed.   Teeth numbers 10, 9, 8, and 7 were removed using the 301 elevator and the dental forceps.  Then, the sockets were curetted and debrided.  The tissue was trimmed to allow for primary closure.  The buccal tissue was reflected to expose the alveolar crest,  which was irregular in contour.  The alveoloplasty was then performed using an egg burr and the Stryker handpiece under irrigation and then the bone file was used to further smooth this area and then the area was irrigated and closed with 3-0 chromic.   Then, attention was turned to the right side of the mouth with a 15 blade used to make an incision around teeth numbers 3, 5, and 6.  The periosteum was reflected.  The teeth were elevated with a 301 elevator and removed from the mouth with the dental  forceps.  The sockets were curetted and debrided.  Then, the tissue was reflected to expose the alveolar crest, which was again irregular with undercuts.  The alveoloplasty was then performed using the Egg burr under irrigation and Stryker handpiece  followed by the bone file.  Then, the area was irrigated and  closed with 3-0 chromic.  The oral cavity was irrigated and suctioned.  The throat pack was removed.  The patient was left in care of anesthesia for extubation and transport to recovery with  plans for discharge home through day surgery.  ESTIMATED BLOOD LOSS:  Minimal.  COMPLICATIONS:  None.  SPECIMENS:  None.  COUNTS:  Correct.    PUS D: 09/17/2023 2:53:54 pm T: 09/17/2023 4:03:00 pm  JOB: [id number]/ [id number]

## 2023-09-17 NOTE — Op Note (Signed)
 09/17/2023  2:48 PM  PATIENT:  Dedra Skeens  33 y.o. male  PRE-OPERATIVE DIAGNOSIS:  NON-RESTORABLE TEETH # 3, 5, 6, 7, 8, 9, 10, 11, 12, 13, 14, 15, SECONDARY TO DENTAL CARIES  POST-OPERATIVE DIAGNOSIS:  SAME  PEXTRACTIONS TEETH # 3, 5, 6, 7, 8, 9, 10, 11, 12, 13, 14, 15, ALVEOLOPLASTY RIGHT AND LEFT MAXILLA  SURGEON:  Surgeon(s): Ocie Doyne, DMD  ANESTHESIA:   local and general  EBL:  minimal  DRAINS: none   SPECIMEN:  No Specimen  COUNTS:  YES  PLAN OF CARE: Discharge to home after PACU  PATIENT DISPOSITION:  PACU - hemodynamically stable.   PROCEDURE DETAILS: Dictation # [id number]  Georgia Lopes, DMD 09/17/2023 2:48 PM

## 2023-09-17 NOTE — Transfer of Care (Signed)
 Immediate Anesthesia Transfer of Care Note  Patient: Alex Callahan  Procedure(s) Performed: DENTAL RESTORATION/EXTRACTIONS  Patient Location: PACU  Anesthesia Type:General  Level of Consciousness: awake, alert , and oriented  Airway & Oxygen Therapy: Patient Spontanous Breathing  Post-op Assessment: Report given to RN  Post vital signs: Reviewed and stable  Last Vitals:  Vitals Value Taken Time  BP 156/97 09/17/23 1506  Temp 37 C 09/17/23 1506  Pulse 91 09/17/23 1509  Resp 15 09/17/23 1509  SpO2 99 % 09/17/23 1509  Vitals shown include unfiled device data.  Last Pain:  Vitals:   09/17/23 1506  TempSrc:   PainSc: 0-No pain      Patients Stated Pain Goal: 0 (09/17/23 1144)  Complications: No notable events documented.

## 2023-09-17 NOTE — H&P (Signed)
 H&P documentation  -History and Physical Reviewed  -Patient has been re-examined  -No change in the plan of care  Alex Callahan

## 2023-09-18 ENCOUNTER — Encounter (HOSPITAL_COMMUNITY): Payer: Self-pay | Admitting: Oral Surgery

## 2023-09-18 NOTE — Anesthesia Postprocedure Evaluation (Signed)
 Anesthesia Post Note  Patient: Alex Callahan  Procedure(s) Performed: DENTAL RESTORATION/EXTRACTIONS     Patient location during evaluation: PACU Anesthesia Type: General Level of consciousness: awake and alert, patient cooperative and oriented Pain management: pain level controlled Vital Signs Assessment: post-procedure vital signs reviewed and stable Respiratory status: spontaneous breathing, nonlabored ventilation and respiratory function stable Cardiovascular status: blood pressure returned to baseline and stable Postop Assessment: no apparent nausea or vomiting and able to ambulate Anesthetic complications: no   No notable events documented.  Last Vitals:  Vitals:   09/17/23 1530 09/17/23 1536  BP: 122/87 127/85  Pulse: 84 89  Resp: 11 10  Temp:  37 C  SpO2: 93% 95%    Last Pain:  Vitals:   09/17/23 1530  TempSrc:   PainSc: 0-No pain                 Garron Eline,E. [name]
